# Patient Record
Sex: Male | Born: 1965 | Race: White | Hispanic: No | Marital: Single | Smoking: Current every day smoker
Health system: Southern US, Community
[De-identification: ages and names within clinical notes are randomized; demographics above are authoritative.]

## PROBLEM LIST (undated history)

## (undated) DIAGNOSIS — J189 Pneumonia, unspecified organism: Secondary | ICD-10-CM

## (undated) DIAGNOSIS — N200 Calculus of kidney: Secondary | ICD-10-CM

## (undated) DIAGNOSIS — M1990 Unspecified osteoarthritis, unspecified site: Secondary | ICD-10-CM

## (undated) HISTORY — PX: LITHOTRIPSY: SUR834

## (undated) HISTORY — PX: NOSE SURGERY: SHX723

---

## 2010-08-28 ENCOUNTER — Emergency Department (HOSPITAL_COMMUNITY)
Admission: EM | Admit: 2010-08-28 | Discharge: 2010-08-28 | Disposition: A | Discharge status: Home / Self Care | Payer: Self-pay | Attending: Emergency Medicine | Admitting: Emergency Medicine

## 2010-08-28 DIAGNOSIS — N200 Calculus of kidney: Secondary | ICD-10-CM | POA: Insufficient documentation

## 2010-08-28 DIAGNOSIS — Z9889 Other specified postprocedural states: Secondary | ICD-10-CM | POA: Insufficient documentation

## 2010-08-28 DIAGNOSIS — R319 Hematuria, unspecified: Secondary | ICD-10-CM | POA: Insufficient documentation

## 2010-08-28 DIAGNOSIS — R109 Unspecified abdominal pain: Secondary | ICD-10-CM | POA: Insufficient documentation

## 2010-08-28 LAB — URINALYSIS, ROUTINE W REFLEX MICROSCOPIC
Bilirubin Urine: NEGATIVE
Nitrite: NEGATIVE
Protein, ur: NEGATIVE mg/dL
Urobilinogen, UA: 0.2 mg/dL (ref 0.0–1.0)
pH: 7 (ref 5.0–8.0)

## 2010-08-29 LAB — URINE CULTURE
Colony Count: 2000
Culture  Setup Time: 201207282004

## 2010-08-30 LAB — POCT I-STAT, CHEM 8
Calcium, Ion: 1.13 mmol/L (ref 1.12–1.32)
Chloride: 104 mEq/L (ref 96–112)
Glucose, Bld: 91 mg/dL (ref 70–99)
HCT: 46 % (ref 39.0–52.0)
Hemoglobin: 15.6 g/dL (ref 13.0–17.0)
TCO2: 24 mmol/L (ref 0–100)

## 2012-07-15 ENCOUNTER — Encounter (HOSPITAL_BASED_OUTPATIENT_CLINIC_OR_DEPARTMENT_OTHER): Payer: Self-pay | Admitting: *Deleted

## 2012-07-15 ENCOUNTER — Emergency Department (HOSPITAL_BASED_OUTPATIENT_CLINIC_OR_DEPARTMENT_OTHER)
Admission: EM | Admit: 2012-07-15 | Discharge: 2012-07-15 | Disposition: A | Discharge status: Home / Self Care | Payer: Self-pay | Attending: Emergency Medicine | Admitting: Emergency Medicine

## 2012-07-15 ENCOUNTER — Emergency Department (HOSPITAL_BASED_OUTPATIENT_CLINIC_OR_DEPARTMENT_OTHER): Payer: Self-pay

## 2012-07-15 DIAGNOSIS — M19072 Primary osteoarthritis, left ankle and foot: Secondary | ICD-10-CM

## 2012-07-15 DIAGNOSIS — Z8781 Personal history of (healed) traumatic fracture: Secondary | ICD-10-CM | POA: Insufficient documentation

## 2012-07-15 DIAGNOSIS — Z8739 Personal history of other diseases of the musculoskeletal system and connective tissue: Secondary | ICD-10-CM | POA: Insufficient documentation

## 2012-07-15 DIAGNOSIS — M19079 Primary osteoarthritis, unspecified ankle and foot: Secondary | ICD-10-CM | POA: Insufficient documentation

## 2012-07-15 HISTORY — DX: Unspecified osteoarthritis, unspecified site: M19.90

## 2012-07-15 MED ORDER — HYDROCODONE-ACETAMINOPHEN 5-325 MG PO TABS: 2.0000 | ORAL_TABLET | Freq: Four times a day (QID) | ORAL | Status: DC | PRN

## 2012-07-15 NOTE — ED Notes (Signed)
MD at bedside. 

## 2012-07-15 NOTE — ED Notes (Signed)
Patient here with ongoing right great toe pain for months, hit the toe that has caused increased pain, pain with ambulation. Thinks he has arthritis, no redness to same

## 2012-07-15 NOTE — ED Provider Notes (Signed)
History  This chart was scribed for Charles B. Bernette Mayers, MD by Manuela Schwartz, ED scribe. This patient was seen in room MHT13/MHT13 and the patient's care was started at 1939.   CSN: [id number]  Arrival date & time 07/15/12  4098   First MD Initiated Contact with Patient 07/15/12 2015      Chief Complaint  Patient presents with  . Toe Pain   Patient is a 47 y.o. male presenting with toe pain. The history is provided by the patient. No language interpreter was used.  Toe Pain This is a new problem. The current episode started 3 to 5 hours ago. The problem occurs constantly. The problem has not changed since onset.Pertinent negatives include no chest pain and no shortness of breath. The symptoms are aggravated by walking. Nothing relieves the symptoms. He has tried nothing for the symptoms.   HPI Comments: Elijah Reyes is a 47 y.o. male who presents to the Emergency Department complaining of constant moderate right great toe pain after he stubbed it today at work. He states hx of arthritis and has previously broken his toe before. He states this is a recurrent injury and now painful with ambulation. He denies any other injuries and has not tried any medications at home for this problem.    Past Medical History  Diagnosis Date  . Arthritis     History reviewed. No pertinent past surgical history.  No family history on file.  History  Substance Use Topics  . Smoking status: Not on file  . Smokeless tobacco: Not on file  . Alcohol Use: Not on file      Review of Systems  Constitutional: Negative for fever and chills.  Respiratory: Negative for shortness of breath.   Cardiovascular: Negative for chest pain.  Gastrointestinal: Negative for nausea and vomiting.  Musculoskeletal:       Right great toe pain  Neurological: Negative for weakness.  All other systems reviewed and are negative.   A complete 10 system review of systems was obtained and all systems are negative except as  noted in the HPI and PMH.   Allergies  Erythromycin  Home Medications  No current outpatient prescriptions on file.  Triage Vitals: BP 153/98  Pulse 66  Temp(Src) 98.2 F (36.8 C) (Oral)  Resp 20  Ht 6' (1.829 m)  Wt 205 lb (92.987 kg)  BMI 27.8 kg/m2  SpO2 98%  Physical Exam  Constitutional: He is oriented to person, place, and time. He appears well-developed and well-nourished.  HENT:  Head: Normocephalic and atraumatic.  Neck: Neck supple.  Pulmonary/Chest: Effort normal.  Neurological: He is alert and oriented to person, place, and time. No cranial nerve deficit.  Psychiatric: He has a normal mood and affect. His behavior is normal.    ED Course  Procedures (including critical care time) DIAGNOSTIC STUDIES: Oxygen Saturation is 98% on room air, normal by my interpretation.    COORDINATION OF CARE: At 815 PM Discussed treatment plan with patient which includes right toe X-ray. Patient agrees.   Labs Reviewed - No data to display Dg Toe Great Right  07/15/2012   *RADIOLOGY REPORT*  Clinical Data: Toe injury with pain  RIGHT GREAT TOE  Comparison: none  Findings: Negative for fracture.  Normal alignment.  Mild to moderate degenerative change in the first MTP.  IMPRESSION: Negative for fracture.   Original Report Authenticated By: Janeece Riggers, M.D.     1. Osteoarthritis of toe joint, left  MDM  I personally performed the services described in this documentation, which was scribed in my presence. The recorded information has been reviewed and is accurate.   Xray neg for acute fracture. Pt requesting referral to see a doctor about his arthritis. Norco for pain.          Charles B. Bernette Mayers, MD 07/15/12 2316

## 2012-07-15 NOTE — ED Notes (Signed)
Primary assessment at 1945 by this RN is documented in error.

## 2012-08-31 ENCOUNTER — Emergency Department (HOSPITAL_COMMUNITY)
Admission: EM | Admit: 2012-08-31 | Discharge: 2012-08-31 | Disposition: A | Discharge status: Home / Self Care | Payer: Self-pay | Attending: Emergency Medicine | Admitting: Emergency Medicine

## 2012-08-31 ENCOUNTER — Emergency Department (HOSPITAL_COMMUNITY): Payer: Self-pay

## 2012-08-31 ENCOUNTER — Encounter (HOSPITAL_COMMUNITY): Payer: Self-pay | Admitting: Emergency Medicine

## 2012-08-31 DIAGNOSIS — M19071 Primary osteoarthritis, right ankle and foot: Secondary | ICD-10-CM

## 2012-08-31 DIAGNOSIS — M19079 Primary osteoarthritis, unspecified ankle and foot: Secondary | ICD-10-CM | POA: Insufficient documentation

## 2012-08-31 DIAGNOSIS — G8929 Other chronic pain: Secondary | ICD-10-CM | POA: Insufficient documentation

## 2012-08-31 MED ORDER — MELOXICAM 7.5 MG PO TABS: 7.5000 mg | ORAL_TABLET | Freq: Every day | ORAL | Status: DC

## 2012-08-31 MED ORDER — HYDROCODONE-ACETAMINOPHEN 5-325 MG PO TABS: ORAL_TABLET | ORAL | Status: DC
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
## 2012-09-01 NOTE — ED Provider Notes (Signed)
Medical screening examination/treatment/procedure(s) were performed by non-physician practitioner and as supervising physician I was immediately available for consultation/collaboration.   Kaveri Perras J. Jarin Cornfield, MD 09/01/12 1752 

## 2013-02-01 ENCOUNTER — Emergency Department (HOSPITAL_COMMUNITY): Payer: Self-pay

## 2013-02-01 ENCOUNTER — Emergency Department (HOSPITAL_COMMUNITY)
Admission: EM | Admit: 2013-02-01 | Discharge: 2013-02-01 | Disposition: A | Discharge status: Home / Self Care | Payer: Self-pay | Attending: Emergency Medicine | Admitting: Emergency Medicine

## 2013-02-01 ENCOUNTER — Encounter (HOSPITAL_COMMUNITY): Payer: Self-pay | Admitting: Emergency Medicine

## 2013-02-01 DIAGNOSIS — M791 Myalgia, unspecified site: Secondary | ICD-10-CM

## 2013-02-01 DIAGNOSIS — M129 Arthropathy, unspecified: Secondary | ICD-10-CM | POA: Insufficient documentation

## 2013-02-01 DIAGNOSIS — T148XXA Other injury of unspecified body region, initial encounter: Secondary | ICD-10-CM

## 2013-02-01 DIAGNOSIS — W1809XA Striking against other object with subsequent fall, initial encounter: Secondary | ICD-10-CM | POA: Insufficient documentation

## 2013-02-01 DIAGNOSIS — IMO0001 Reserved for inherently not codable concepts without codable children: Secondary | ICD-10-CM | POA: Insufficient documentation

## 2013-02-01 DIAGNOSIS — Y92009 Unspecified place in unspecified non-institutional (private) residence as the place of occurrence of the external cause: Secondary | ICD-10-CM | POA: Insufficient documentation

## 2013-02-01 DIAGNOSIS — F17200 Nicotine dependence, unspecified, uncomplicated: Secondary | ICD-10-CM | POA: Insufficient documentation

## 2013-02-01 DIAGNOSIS — Y9389 Activity, other specified: Secondary | ICD-10-CM | POA: Insufficient documentation

## 2013-02-01 DIAGNOSIS — S79919A Unspecified injury of unspecified hip, initial encounter: Secondary | ICD-10-CM | POA: Insufficient documentation

## 2013-02-01 DIAGNOSIS — S79929A Unspecified injury of unspecified thigh, initial encounter: Secondary | ICD-10-CM

## 2013-02-01 DIAGNOSIS — Z79899 Other long term (current) drug therapy: Secondary | ICD-10-CM | POA: Insufficient documentation

## 2013-02-01 DIAGNOSIS — S300XXA Contusion of lower back and pelvis, initial encounter: Secondary | ICD-10-CM | POA: Insufficient documentation

## 2013-02-01 MED ORDER — METHOCARBAMOL 500 MG PO TABS: 500.0000 mg | ORAL_TABLET | Freq: Two times a day (BID) | ORAL | Status: DC

## 2013-02-01 MED ORDER — PREDNISONE 20 MG PO TABS
ORAL_TABLET | ORAL | Status: DC
Start: 2013-02-01 — End: 2013-04-18

## 2013-02-01 NOTE — ED Notes (Addendum)
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 States he initially felt okay but has noticed for past few days his tailbone and R hip has been aching and it woke him from his sleep this am. Ambulatory, mae

## 2013-02-01 NOTE — ED Provider Notes (Signed)
CSN: [id number]     Arrival date & time 02/01/13  1702 History  This chart was scribed for Elijah Mutton, PA-C, working with Gerhard Munch, MD, by Ardelia Mems ED Scribe. This patient was seen in room TR06C/TR06C and the patient's care was started at 7:44 PM.   Chief Complaint  Patient presents with  . Tailbone Pain    The history is provided by the patient. No language interpreter was used.    HPI Comments: Elijah Reyes is a 48 y.o. male who presents to the Emergency Department complaining of intermittent, moderate right buttock, right hip and posterior right upper leg pain over the past few days. He states that his pain was onset by a fall through a rotten floor while working in a house, and striking his right buttocks on a bathtub. He describes his pain as aching. He states that his pain is worsened with lying supine or sitting down. He states that he has been taking Ibuprofen without relief. He denies numbness, tingling or loss of sensation. He denies bowel or bladder incontinence, testicular pain or swelling or any other symptoms.   Past Medical History  Diagnosis Date  . Arthritis    History reviewed. No pertinent past surgical history. History reviewed. No pertinent family history. History  Substance Use Topics  . Smoking status: Current Every Day Smoker    Types: Cigarettes  . Smokeless tobacco: Not on file  . Alcohol Use: No    Review of Systems  Gastrointestinal:       Denies bowel incontinence  Genitourinary: Negative for scrotal swelling and testicular pain.       Denies bladder incontinence  Musculoskeletal:       Right buttock, right hip and posterior right upper leg pain  Neurological: Negative for numbness.  All other systems reviewed and are negative.   Allergies  Erythromycin  Home Medications   Current Outpatient Rx  Name  Route  Sig  Dispense  Refill  . ibuprofen (ADVIL,MOTRIN) 200 MG tablet   Oral   Take 800 mg by mouth every 6 (six) hours as  needed (pain).         . Papaya CHEW   Oral   Chew 2 tablets by mouth 2 (two) times daily.         . ranitidine (ZANTAC) 150 MG tablet   Oral   Take 450 mg by mouth 2 (two) times daily.         . methocarbamol (ROBAXIN) 500 MG tablet   Oral   Take 1 tablet (500 mg total) by mouth 2 (two) times daily.   20 tablet   0   . predniSONE (DELTASONE) 20 MG tablet      3 tabs po day one, then 2 tabs daily x 4 days   11 tablet   0    Triage Vitals: BP 152/83  Pulse 72  Temp(Src) 98.5 F (36.9 C) (Oral)  Resp 16  Ht 6' (1.829 m)  Wt 220 lb (99.791 kg)  BMI 29.83 kg/m2  SpO2 97%  Physical Exam  Nursing note and vitals reviewed. Constitutional: He is oriented to person, place, and time. He appears well-developed and well-nourished. No distress.  HENT:  Head: Normocephalic and atraumatic.  Neck: Normal range of motion. Neck supple.  Musculoskeletal: Normal range of motion. He exhibits tenderness.  Negative deformities, swelling, erythema, inflammation, lesions, sores noted to the lumbar spine. Neck mild discomfort upon palpation to the right buttock and posterior aspect of the right  thigh. Full range of motion noted to the right hip, right knee, right ankle. Negative ataxia identified with motion. Patient able to fully flex and extend of the torso without difficulty.  Neurological: He is alert and oriented to person, place, and time. No cranial nerve deficit. He exhibits normal muscle tone. Coordination normal.  Cranial nerves III-XII grossly intact Strength 5+/5+ to lower extremities bilaterally with resistance applied, equal distribution noted Sensation intact with differentiation to sharp and dull touch to lower extremities bilaterally Heel to knee down shin normal bilaterally Gait proper, proper balance - negative sway, negative drift, negative step-offs  Skin: He is not diaphoretic.    ED Course  Procedures (including critical care time)  DIAGNOSTIC STUDIES: Oxygen  Saturation is 97% on RA, normal by my interpretation.    COORDINATION OF CARE: 7:50 PM- Discussed radiology findings. Pt advised of plan for treatment and pt agrees.  Dg Sacrum/coccyx  02/01/2013   CLINICAL DATA:  History of trauma from a fall.  Buttock pain.  EXAM: SACRUM AND COCCYX - 2+ VIEW  COMPARISON:  No priors.  FINDINGS: Three views of the sacrum and coccyx demonstrate no definite acute displaced fractures.  IMPRESSION: 1. No acute radiographic abnormality of the sacrum or coccyx.   Electronically Signed   By: Trudie Reed M.D.   On: 02/01/2013 18:48   Dg Hip Bilateral W/pelvis  02/01/2013   CLINICAL DATA:  History of fall complaining of low back and right ischial pain.  EXAM: BILATERAL HIP WITH PELVIS - 4+ VIEW  COMPARISON:  No priors.  FINDINGS: AP view of the pelvis and AP and lateral views of the left hip demonstrate no acute displaced fracture, subluxation, dislocation, joint or soft tissue abnormality.  IMPRESSION: 1. No acute radiographic abnormality of the bony pelvis or the left hip.   Electronically Signed   By: Trudie Reed M.D.   On: 02/01/2013 18:46    Labs Review Labs Reviewed - No data to display Imaging Review Dg Sacrum/coccyx  02/01/2013   CLINICAL DATA:  History of trauma from a fall.  Buttock pain.  EXAM: SACRUM AND COCCYX - 2+ VIEW  COMPARISON:  No priors.  FINDINGS: Three views of the sacrum and coccyx demonstrate no definite acute displaced fractures.  IMPRESSION: 1. No acute radiographic abnormality of the sacrum or coccyx.   Electronically Signed   By: Trudie Reed M.D.   On: 02/01/2013 18:48   Dg Hip Bilateral W/pelvis  02/01/2013   CLINICAL DATA:  History of fall complaining of low back and right ischial pain.  EXAM: BILATERAL HIP WITH PELVIS - 4+ VIEW  COMPARISON:  No priors.  FINDINGS: AP view of the pelvis and AP and lateral views of the left hip demonstrate no acute displaced fracture, subluxation, dislocation, joint or soft tissue abnormality.   IMPRESSION: 1. No acute radiographic abnormality of the bony pelvis or the left hip.   Electronically Signed   By: Trudie Reed M.D.   On: 02/01/2013 18:46    EKG Interpretation   None       MDM   1. Contusion of bone   2. Muscular pain     Filed Vitals:   02/01/13 1717 02/01/13 2018  BP: 152/83 140/85  Pulse: 72 66  Temp: 98.5 F (36.9 C) 98 F (36.7 C)  TempSrc: Oral Oral  Resp: 16 16  Height: 6' (1.829 m)   Weight: 220 lb (99.791 kg)   SpO2: 97% 98%   I personally performed the services  described in this documentation, which was scribed in my presence. The recorded information has been reviewed and is accurate.  Patient presented to emergency department with right buttock, right groin and lower back pain that has been ongoing for the past 8-9 days. Patient reports that while working his leg fell through the floor and landed on his groin. Reports he's been using ibuprofen with minimal relief. Alert and oriented. GCS 15. Pulses palpable and strong, DP 2+ bilaterally. Full range of motion to lower extremities bilaterally without difficulty noted. Discomfort upon palpation to right buttock and posterior aspect of right thigh. Sensation intact with differentiation to sharp and dull touch. Strength intact with equal distribution. Negative ataxia noted to motion. Negative deformities, ecchymosis, swelling noted.  Plain films negative findings for acute abnormalities or injuries - negative findings noted to plain film of sacrum/coccyx and bilateral hip with pelvis. Patient stable, afebrile. Doubt cauda equina syndrome. Negative findings for fractures or abnormalities. Suspicion to be muscular discomfort secondary to trauma. Possible bone contusion. Patient neurovascularly intact. Negative ataxia identified a physical exam. Patient discharged. Discharge patient with steroids and muscle relaxers. Referred patient to orthopedics. Discussed with patient to rest, ice and stay hydrated.  Discussed with patient to avoid any physical or strenuous activity. Discussed with patient to closely monitor symptoms and if symptoms are to worsen or change to report back to the ED - strict return instructions given.  Patient agreed to plan of care, understood, all questions answered.      Elijah MuttonMarissa Loki Wuthrich, PA-C 02/02/13 [phone]

## 2013-02-01 NOTE — Discharge Instructions (Signed)
Please call and set up an appointment with Dr. Kevan Ny, orthopedics regarding ongoing right hip and buttock pain at the ongoing for approximately 9 days Please rest and stay hydrated Please massage with icy hot ointment Please apply warm compressions and massage Please avoid any physical or strenuous activity Please take medications as prescribed-please take on a. Neck Please continue monitor symptoms and if symptoms are to worsen or change (fever greater than 101, chills, nausea, vomiting, chest pain, shortness of breath, difficulty breathing, weakness, numbness, loss of sensation, worsening pain, fall, injury, inability to control urine or bowel movements) please report back to the ED  Back Pain, Adult Low back pain is very common. About 1 in 5 people have back pain.The cause of low back pain is rarely dangerous. The pain often gets better over time.About half of people with a sudden onset of back pain feel better in just 2 weeks. About 8 in 10 people feel better by 6 weeks.  CAUSES Some common causes of back pain include:  Strain of the muscles or ligaments supporting the spine.  Wear and tear (degeneration) of the spinal discs.  Arthritis.  Direct injury to the back. DIAGNOSIS Most of the time, the direct cause of low back pain is not known.However, back pain can be treated effectively even when the exact cause of the pain is unknown.Answering your caregiver's questions about your overall health and symptoms is one of the most accurate ways to make sure the cause of your pain is not dangerous. If your caregiver needs more information, he or she may order lab work or imaging tests (X-rays or MRIs).However, even if imaging tests show changes in your back, this usually does not require surgery. HOME CARE INSTRUCTIONS For many people, back pain returns.Since low back pain is rarely dangerous, it is often a condition that people can learn to [employer] their own.   Remain active. It is  stressful on the back to sit or stand in one place. Do not sit, drive, or stand in one place for more than 30 minutes at a time. Take short walks on level surfaces as soon as pain allows.Try to increase the length of time you walk each day.  Do not stay in bed.Resting more than 1 or 2 days can delay your recovery.  Do not avoid exercise or work.Your body is made to move.It is not dangerous to be active, even though your back may hurt.Your back will likely heal faster if you return to being active before your pain is gone.  Pay attention to your body when you bend and lift. Many people have less discomfortwhen lifting if they bend their knees, keep the load close to their bodies,and avoid twisting. Often, the most comfortable positions are those that put less stress on your recovering back.  Find a comfortable position to sleep. Use a firm mattress and lie on your side with your knees slightly bent. If you lie on your back, put a pillow under your knees.  Only take over-the-counter or prescription medicines as directed by your caregiver. Over-the-counter medicines to reduce pain and inflammation are often the most helpful.Your caregiver may prescribe muscle relaxant drugs.These medicines help dull your pain so you can more quickly return to your normal activities and healthy exercise.  Put ice on the injured area.  Put ice in a plastic bag.  Place a towel between your skin and the bag.  Leave the ice on for 15-20 minutes, 03-04 times a day for the first 2  to 3 days. After that, ice and heat may be alternated to reduce pain and spasms.  Ask your caregiver about trying back exercises and gentle massage. This may be of some benefit.  Avoid feeling anxious or stressed.Stress increases muscle tension and can worsen back pain.It is important to recognize when you are anxious or stressed and learn ways to manage it.Exercise is a great option. SEEK MEDICAL CARE IF:  You have pain that is  not relieved with rest or medicine.  You have pain that does not improve in 1 week.  You have new symptoms.  You are generally not feeling well. SEEK IMMEDIATE MEDICAL CARE IF:   You have pain that radiates from your back into your legs.  You develop new bowel or bladder control problems.  You have unusual weakness or numbness in your arms or legs.  You develop nausea or vomiting.  You develop abdominal pain.  You feel faint. Document Released: 01/17/2005 Document Revised: 07/19/2011 Document Reviewed: 06/07/2010 [facility] Patient Information 2014 [place] [place].   Emergency Department Resource Guide 1) Find a Doctor and Pay Out of Pocket Although you won't have to find out who is covered by your insurance plan, it is a good idea to ask around and get recommendations. You will then need to call the office and see if the doctor you have chosen will accept you as a new patient and what types of options they offer for patients who are self-pay. Some doctors offer discounts or will set up payment plans for their patients who do not have insurance, but you will need to ask so you aren't surprised when you get to your appointment.  2) Contact Your Local Health Department Not all health departments have doctors that can see patients for sick visits, but many do, so it is worth a call to see if yours does. If you don't know where your local health department is, you can check in your phone book. The CDC also has a tool to help you locate your state's health department, and many state websites also have listings of all of their local health departments.  3) Find a Walk-in Clinic If your illness is not likely to be very severe or complicated, you may want to try a walk in clinic. These are popping up all over the country in pharmacies, drugstores, and shopping centers. They're usually staffed by nurse practitioners or physician assistants that have been trained to treat common illnesses and  complaints. They're usually fairly quick and inexpensive. However, if you have serious medical issues or chronic medical problems, these are probably not your best option.  No Primary Care Doctor: - Call Health Connect at  [phone] - they can help you locate a primary care doctor that  accepts your insurance, provides certain services, etc. - Physician Referral Service- [phone]  Chronic Pain Problems: Organization         Address  Phone   Notes  [facility]  [phone] Patients need to be referred by their primary care doctor.   Medication Assistance: Organization         Address  Phone   Notes  [facility] [address] [place], [place] [address] [phone] --Must be a resident of [place] -- Must have NO insurance coverage whatsoever (no Medicaid/ Medicare, etc.) -- The pt. MUST have a primary care doctor that directs their care regularly and follows them in the community   [facility]  (  [phone]   [employer]  [phone]    Agencies that provide inexpensive medical care: Organization         Address  Phone   Notes  [facility]  [phone]   [facility]    [phone]   [facility] [address] [place], [place] [address] [phone]   [facility] [address] [place] [place] [phone]   Planned Parenthood    [phone]   [facility]    [phone]   [facility]  [address], [place] Phone:  [phone], Fax:  [phone] Hours of Operation:  9 am - 6 pm, M-F.  Also accepts Medicaid/Medicare and self-pay.  [facility]  [address], [place] Phone: [phone], Fax: [phone]. Hours of Operation:  8:30 am - 5:30 pm, M-F.  Also accepts Medicaid and self-pay.  [facility] [place] [address] [place] Phone: [phone]   [facility] [address] Natasha Bence [place], [place] [phone], Ext. [phone] Mondays & Thursdays: 7-9 AM.  First 15 patients are seen on a first come, first serve basis.    Medicaid-accepting [facility] Providers:  Organization         Address  Phone   Notes  [facility] [address],  [phone] Also accepts self-pay patients.  [facility] [address] Laurell Josephs [place], [place]  [phone]   [facility] [address], [place] [phone]   [facility] [address] [place] [phone]   Renaye Rakers [address], [place]   [phone] Only accepts [place] Access [place] patients after they have their name applied to their card.   Self-Pay (no insurance) in [facility]:  Organization         Address  Phone   Notes  Sickle Cell Patients, [facility] [address] [place], [place] [phone]   [facility] [address] [place], [place] [phone]   [facility] [place]  [address], [place] [phone]   [facility]/Dr. Osei-Bonsu  [address], [place] or [address], [place] [phone] Phone number for both [place] and [place] locations is the same.  [facility] and [facility] [address], [place] [phone]   [facility] [address] [place] or [address] [phone]   [facility] [address], [place] [phone], phone; [phone], fax Sees patients 1st and 3rd Saturday of every month.  Must not qualify for public or private insurance (i.e. Medicaid, Medicare, [place] Health Choice, Veterans' Benefits)  Household income should be no more than 200% of the poverty level  The clinic cannot treat you if you are pregnant or think you are pregnant  Sexually transmitted diseases are not treated at the clinic.    Dental Care: Organization         Address  Phone  Notes  [place] [facility] [address] [place], [place] [phone] Accepts children up to age 69 who are enrolled in [place] or  Health Choice; pregnant women  with a Medicaid card; and children who have applied for Medicaid or [place] Health Choice, but were declined, whose parents can pay a reduced fee at time of service.  [facility] of [facility]  [address] [phone] Accepts children up to age 59 who are enrolled in [place] or [place] Health Choice; pregnant women with a Medicaid card; and children who have applied for Medicaid or [place] Health Choice, but were declined, whose parents can pay a reduced fee at time of service.  [facility]  [address] [place], [place] [phone] Patients are seen by appointment only. Walk-ins are not accepted. [facility] will see patients 24 years of age and older. Monday - Tuesday (8am-5pm) Most Wednesdays (8:30-5pm) $30 per visit, cash only  [facility]  [address], [facility] [phone] Patients are seen by appointment only. Walk-ins are not accepted. [facility] will see patients 59 years of age and older. One Wednesday Evening (Monthly: Volunteer Based).  $30 per visit, cash only  [employer]  [phone] for adults; Children under age 62, call Graduate Pediatric Dentistry at [phone]. Children aged 38-14, please call [phone] to request a pediatric application.  Dental services are provided in all areas of dental care including fillings, crowns and bridges, complete and partial dentures, implants, gum treatment, root canals, and extractions. Preventive care is  also provided. Treatment is provided to both adults and children. Patients are selected via a lottery and there is often a waiting list.   [facility] [address] [place]  [phone] [email or web address]   [facility] [address] [place], [place] [phone], Ext. [phone] Second and Fourth Thursday of each month, opens at 6:30 AM; Clinic ends at 9 AM.  Patients are seen on a first-come first-served basis, and a limited number are seen during each clinic.   [facility]  [address] Ether Griffins [place], [place] [phone]   Eligibility Requirements You must have lived in [place], [place], or [place] counties for at least the last three months.   You cannot be eligible for state or federal sponsored [place], including [employer], [place], or [employer].   You generally cannot be eligible for healthcare insurance through your employer.    How to apply: Eligibility screenings are held every Tuesday and Wednesday afternoon from 1:00 pm until 4:00 pm. You do not need an appointment for the interview!  [facility] [address], [place], [place] [phone]   [facility] Health Department  [phone]   [facility] Department  [phone]   [facility] Health Department  [phone]    Behavioral Health Resources in the Community: Intensive Outpatient Programs Organization         Address  Phone  Notes  [facility] Services [address] [place], [place] [phone]   [facility] [address], [place], [place] [phone]   [facility] [address], [place], [place]  [phone]   [facility] [address]  [place], [place] [phone] or [phone]   Substance Abuse Resources Organization         Address  Phone  Notes  [facility] Drug Services  [phone]   [facility]  [phone]   [facility] [place]  [phone]   [facility]  [phone]   Residential & Outpatient Substance Abuse Program  [phone]   Psychological Services Organization         Address  Phone  Notes  [facility]  [phone]   [facility]  [phone]   [facility] [address], [place] [phone] or [phone]    Mobile Crisis Teams Organization         Address  Phone  Notes  [facility], Mobile Crisis Care Unit  [phone]   [facility]  [address] [place], [place] [phone]   Doristine Locks [address] [place] [phone]    Self-Help/Support Groups Organization         Address  Phone             Notes  [facility] [place] - variety of support groups  336- I7437963 Call for more information  Narcotics Anonymous (NA), [facility] [address], [employer] [place]  2 meetings at this location   Statistician         Address  Phone  Notes  [facility] [address],    [place]  [phone]   [facility]  [address], [place] [address], [place], [place] [phone]   [facility] [address] [place], [place] [phone] Admissions: 8am-3pm M-F  [facility] [address]    [place], [place] [phone]   [facility] [address] [place], [place], [place] [phone]   [facility] [address],  [place], [place] [phone]   [facility] - Intensive Outpatient [address] Laurell Joseph[address], [place], [place] [phone]   [facility] ([facility] [address]  [place], [place] [phone] or [phone]   [facility] ([facility]) [address] [place], [place] [phone] Accepts Medicaid  [facility] [address],  [place] [phone]  Substance Abuse/Addiction Treatment   [facility] Organization         Address  Phone  Notes  [facility]  [phone]   Angie Fava, PhD [address] Ervin Knack [place], [place]   [phone] or [phone]   [facility]   [address] [place], [place] [phone]   [facility] [address] [place], [place] [phone] Insurance/Medicaid/sponsorship through [facility] and Families [address]                                    [place], [place] [phone] Therapy/tele-psych/case  [facility] [address], [place] [phone]    Dr. Lolly Mustache  [phone]   [facility]  [employer] [facility] Dept. 1) [address], [place] 2) [address], [place] 3)  [address], [place] [phone]  [phone]   [facility] Child Abuse Hotline (432)[phone] or [phone] (After Hours)

## 2013-02-03 NOTE — ED Provider Notes (Signed)
  I personally performed the services described in this documentation, which was scribed in my presence. The recorded information has been reviewed and is accurate.     Ayano Douthitt, MD 02/03/13 0002 

## 2013-04-18 ENCOUNTER — Emergency Department (HOSPITAL_COMMUNITY): Payer: Self-pay

## 2013-04-18 ENCOUNTER — Encounter (HOSPITAL_COMMUNITY): Payer: Self-pay | Admitting: Emergency Medicine

## 2013-04-18 ENCOUNTER — Emergency Department (HOSPITAL_COMMUNITY)
Admission: EM | Admit: 2013-04-18 | Discharge: 2013-04-18 | Disposition: A | Discharge status: Home / Self Care | Payer: Self-pay | Attending: Emergency Medicine | Admitting: Emergency Medicine

## 2013-04-18 DIAGNOSIS — Z87442 Personal history of urinary calculi: Secondary | ICD-10-CM | POA: Insufficient documentation

## 2013-04-18 DIAGNOSIS — M129 Arthropathy, unspecified: Secondary | ICD-10-CM | POA: Insufficient documentation

## 2013-04-18 DIAGNOSIS — Z23 Encounter for immunization: Secondary | ICD-10-CM | POA: Insufficient documentation

## 2013-04-18 DIAGNOSIS — Y929 Unspecified place or not applicable: Secondary | ICD-10-CM | POA: Insufficient documentation

## 2013-04-18 DIAGNOSIS — S61409A Unspecified open wound of unspecified hand, initial encounter: Secondary | ICD-10-CM | POA: Insufficient documentation

## 2013-04-18 DIAGNOSIS — Z881 Allergy status to other antibiotic agents status: Secondary | ICD-10-CM | POA: Insufficient documentation

## 2013-04-18 DIAGNOSIS — Z79899 Other long term (current) drug therapy: Secondary | ICD-10-CM | POA: Insufficient documentation

## 2013-04-18 DIAGNOSIS — Y93H9 Activity, other involving exterior property and land maintenance, building and construction: Secondary | ICD-10-CM | POA: Insufficient documentation

## 2013-04-18 DIAGNOSIS — S61412A Laceration without foreign body of left hand, initial encounter: Secondary | ICD-10-CM

## 2013-04-18 DIAGNOSIS — W268XXA Contact with other sharp object(s), not elsewhere classified, initial encounter: Secondary | ICD-10-CM | POA: Insufficient documentation

## 2013-04-18 DIAGNOSIS — F17200 Nicotine dependence, unspecified, uncomplicated: Secondary | ICD-10-CM | POA: Insufficient documentation

## 2013-04-18 HISTORY — DX: Calculus of kidney: N20.0

## 2013-04-18 MED ORDER — HYDROCODONE-ACETAMINOPHEN 5-325 MG PO TABS
2.0000 | ORAL_TABLET | Freq: Once | ORAL | Status: AC
  Administered 2013-04-18: 2 via ORAL
  Filled 2013-04-18: qty 2

## 2013-04-18 MED ORDER — TETANUS-DIPHTH-ACELL PERTUSSIS 5-2.5-18.5 LF-MCG/0.5 IM SUSP
0.5000 mL | Freq: Once | INTRAMUSCULAR | Status: AC
  Administered 2013-04-18: 0.5 mL via INTRAMUSCULAR
  Filled 2013-04-18: qty 0.5

## 2013-04-18 NOTE — ED Provider Notes (Signed)
CSN: [id number]     Arrival date & time 04/18/13  1742 History   This chart was scribed for non-physician practitioner Dierdre Forth, PA-C working with Gavin Pound. Oletta Lamas, MD by Donne Anon, ED Scribe. This patient was seen in room TR07C/TR07C and the patient's care was started at 1833.  First MD Initiated Contact with Patient 04/18/13 1833     Chief Complaint  Patient presents with  . Laceration    The history is provided by the patient and medical records. No language interpreter was used.   HPI Comments: Elijah Reyes is a 48 y.o. male who presents to the Emergency Department complaining of a laceration to his left hand that occurred 2 hours prior to being seen while he was cutting tile and accidentally cut himself with a rotary cutter. He states the tile shattered, and it is possible that pieces of tile are in the wound. He denies numbness or tingling in his fingers, or any other symptoms. He did not take anything for the pain or apply anything to the wound other than wrapping his hand.   His tetanus is not UTD. He states he is otherwise healthy and reports his only daily medication as Zantac. He is a current everyday smoker.    Past Medical History  Diagnosis Date  . Arthritis   . Kidney stone    Past Surgical History  Procedure Laterality Date  . Lithotripsy    . Nose surgery     No family history on file. History  Substance Use Topics  . Smoking status: Current Every Day Smoker    Types: Cigarettes  . Smokeless tobacco: Not on file  . Alcohol Use: No    Review of Systems  Constitutional: Negative for fever and chills.  HENT: Negative for congestion and sore throat.   Gastrointestinal: Negative for nausea and vomiting.  Musculoskeletal: Negative for back pain.  Skin: Positive for wound.  Neurological: Negative for weakness and numbness.      Allergies  Erythromycin  Home Medications   Current Outpatient Rx  Name  Route  Sig  Dispense  Refill  . ibuprofen  (ADVIL,MOTRIN) 200 MG tablet   Oral   Take 800 mg by mouth every 6 (six) hours as needed (pain).         . ranitidine (ZANTAC) 150 MG tablet   Oral   Take 450 mg by mouth 2 (two) times daily.          BP 132/83  Pulse 60  Temp(Src) 97.9 F (36.6 C) (Oral)  Resp 22  Ht 6' (1.829 m)  Wt 213 lb 11.2 oz (96.934 kg)  BMI 28.98 kg/m2  SpO2 100%  Physical Exam  Nursing note and vitals reviewed. Constitutional: He is oriented to person, place, and time. He appears well-developed and well-nourished. No distress.  HENT:  Head: Normocephalic and atraumatic.  Eyes: Conjunctivae are normal. No scleral icterus.  Neck: Normal range of motion.  Cardiovascular: Normal rate, regular rhythm, normal heart sounds and intact distal pulses.   No murmur heard. Capillary refill < 3 sec  Pulmonary/Chest: Effort normal and breath sounds normal. No respiratory distress.  Musculoskeletal: Normal range of motion. He exhibits no edema.  4 cm jagged laceration to the medial palm of the left hand.  Full range of motion of all fingers of the left hand; full range of motion of the left wrist  Neurological: He is alert and oriented to person, place, and time. He exhibits normal muscle tone. Coordination normal.  Normal sensation in all digits of the left hand. 5/5 strength including resisted flexion and extension of all digits. Strong grip strength.  Skin: Skin is warm and dry. He is not diaphoretic. No erythema.  Psychiatric: He has a normal mood and affect.    ED Course  Procedures (including critical care time) DIAGNOSTIC STUDIES: Oxygen Saturation is 100% on RA, normal by my interpretation.    COORDINATION OF CARE: 7:11 PM Discussed treatment plan which includes Vicodin, having pt soak hand in soap and water prior to hand xray and laceration repair with pt at bedside and pt agreed to plan. He has a ride home.   8:29 PM Rechecked pt. Discussed negative imaging results. Laceration repair performed.  Wound care discussed. Return precautions discussed. Advised pt to have sutures removed in 7-10 days.   LACERATION REPAIR PROCEDURE NOTE The patient's identification was confirmed and consent was obtained. This procedure was performed by Dierdre ForthHannah Arvil Utz, PA-C at 8:29 PM. Site: medial aspect of left hand Sterile procedures observed Anesthetic used (type and amt): 7 cc of lidocaine 2% without epinephrine  Suture type/size: 5.0 prolene  Length:4 cm jagged laceration # of Sutures: 6 Technique: simple interrupted Complexity: complex Tetanus ordered Site anesthetized, irrigated with NS, explored without evidence of foreign body, wound well approximated, site covered with dry, sterile dressing.  Patient tolerated procedure well without complications. Instructions for care discussed verbally and patient provided with additional written instructions for homecare and f/u.    Labs Review Labs Reviewed - No data to display Imaging Review Dg Hand 2 View Left  04/18/2013   CLINICAL DATA:  Laceration.  EXAM: LEFT HAND - 2 VIEW  COMPARISON:  None.  FINDINGS: The joint spaces are maintained. No acute fractures identified. No radiopaque foreign body.  IMPRESSION: No acute bony findings or radiopaque foreign body.   Electronically Signed   By: Loralie ChampagneMark  Gallerani M.D.   On: 04/18/2013 20:09     EKG Interpretation None      MDM   Final diagnoses:  None   Elijah ColumbiaJoseph Reyes presents with laceration to the left palm.  Tdap booster given. Pressure irrigation performed. Wound explored. No evidence of foreign body at the base of the wound. Laceration occurred < 8 hours prior to repair which was well tolerated. Pt has no co morbidities to effect normal wound healing. Discussed suture home care w pt and answered questions. Pt to f-u for wound check and suture removal in 7 days. Pt is hemodynamically stable w no complaints prior to dc.    It has been determined that no acute conditions requiring further  emergency intervention are present at this time. The patient/guardian have been advised of the diagnosis and plan. We have discussed signs and symptoms that warrant return to the ED, such as changes or worsening in symptoms.   Vital signs are stable at discharge.   BP 132/83  Pulse 60  Temp(Src) 97.9 F (36.6 C) (Oral)  Resp 22  Ht 6' (1.829 m)  Wt 213 lb 11.2 oz (96.934 kg)  BMI 28.98 kg/m2  SpO2 100%  Patient/guardian has voiced understanding and agreed to follow-up with the PCP or specialist.    I personally performed the services described in this documentation, which was scribed in my presence. The recorded information has been reviewed and is accurate.    Dahlia ClientHannah Chaniah Cisse, PA-C 04/18/13 2159

## 2013-04-18 NOTE — ED Notes (Signed)
Pt st's he was cutting tile and cut his left hand.   Pt has lac to side of left hand.  Bleeding controlled at this time

## 2013-04-18 NOTE — ED Notes (Signed)
Patient transported to X-ray 

## 2013-04-18 NOTE — Discharge Instructions (Signed)
1. Medications: usual home medications 2. Treatment: rest, drink plenty of fluids, tylenol/ibuprofen for pain, use ice for swelling 3. Follow Up: Please return to the ED or an urgent care for suture removal in 7-10 days.    Follow up with your doctor, an urgent care, or this Emergency Department for removal of your stitches in 7 days. Do not submerge the stitches in water for the first 24 hours. Tylenol or ibuprofen for pain control.  Keep bandage dry. Read instructions below.  TREATMENT   Keep the wound clean and dry.   If you were given a bandage (dressing), you should change it at least once a day. Also, change the dressing if it becomes wet or dirty, or as directed by your caregiver.   Wash the wound with soap and water 2 times a day. Rinse the wound off with water to remove all soap. Pat the wound dry with a clean towel.   You may shower as usual after the first 24 hours. Do not soak the wound in water until the sutures are removed.   Once the wound has healed, scarring can be minimized by covering the wound with sunscreen during the day for 1 full year.Marland Kitchen.   SEEK MEDICAL CARE IF:   You have redness, swelling, or increasing pain in the wound.   You see a red line that goes away from the wound.   You have yellowish-white fluid (pus) coming from the wound.   You have a fever.   You notice a bad smell coming from the wound or dressing.   Your wound breaks open before or after sutures have been removed.   You notice something coming out of the wound such as wood or glass.   Your wound is on your hand or foot and you cannot move a finger or toe.   Your pain is not controlled with prescribed medicine.   If you did not receive a tetanus shot today because you thought you were up to date, but did not recall when your last one was given, make sure to check with your primary caregiver to determine if you need one.

## 2013-04-22 NOTE — ED Provider Notes (Signed)
Medical screening examination/treatment/procedure(s) were performed by non-physician practitioner and as supervising physician I was immediately available for consultation/collaboration.  Homar Weinkauf Y. Sharetta Ricchio, MD 04/22/13 0916 

## 2015-09-15 ENCOUNTER — Encounter (HOSPITAL_COMMUNITY): Payer: Self-pay | Admitting: *Deleted

## 2015-09-15 ENCOUNTER — Emergency Department (HOSPITAL_COMMUNITY)
Admission: EM | Admit: 2015-09-15 | Discharge: 2015-09-15 | Disposition: A | Discharge status: Home / Self Care | Payer: Self-pay | Attending: Emergency Medicine | Admitting: Emergency Medicine

## 2015-09-15 ENCOUNTER — Emergency Department (HOSPITAL_COMMUNITY): Payer: Self-pay

## 2015-09-15 DIAGNOSIS — F17210 Nicotine dependence, cigarettes, uncomplicated: Secondary | ICD-10-CM | POA: Insufficient documentation

## 2015-09-15 DIAGNOSIS — Z791 Long term (current) use of non-steroidal anti-inflammatories (NSAID): Secondary | ICD-10-CM | POA: Insufficient documentation

## 2015-09-15 DIAGNOSIS — R0789 Other chest pain: Secondary | ICD-10-CM | POA: Insufficient documentation

## 2015-09-15 HISTORY — DX: Pneumonia, unspecified organism: J18.9

## 2015-09-15 LAB — CBC WITH DIFFERENTIAL/PLATELET
Basophils Absolute: 0 10*3/uL (ref 0.0–0.1)
Basophils Relative: 0 %
Eosinophils Absolute: 0.2 10*3/uL (ref 0.0–0.7)
Eosinophils Relative: 3 %
HCT: 42.1 % (ref 39.0–52.0)
Hemoglobin: 15.2 g/dL (ref 13.0–17.0)
Lymphocytes Relative: 28 %
Lymphs Abs: 2 10*3/uL (ref 0.7–4.0)
MCH: 33.8 pg (ref 26.0–34.0)
MCHC: 36.1 g/dL — ABNORMAL HIGH (ref 30.0–36.0)
MCV: 93.6 fL (ref 78.0–100.0)
Monocytes Absolute: 0.9 10*3/uL (ref 0.1–1.0)
Monocytes Relative: 12 %
Neutro Abs: 4.2 10*3/uL (ref 1.7–7.7)
Neutrophils Relative %: 57 %
Platelets: 168 10*3/uL (ref 150–400)
RBC: 4.5 MIL/uL (ref 4.22–5.81)
RDW: 12.6 % (ref 11.5–15.5)
WBC: 7.3 10*3/uL (ref 4.0–10.5)

## 2015-09-15 LAB — BASIC METABOLIC PANEL
Anion gap: 7 (ref 5–15)
BUN: 17 mg/dL (ref 6–20)
CO2: 24 mmol/L (ref 22–32)
Calcium: 8.7 mg/dL — ABNORMAL LOW (ref 8.9–10.3)
Chloride: 105 mmol/L (ref 101–111)
Creatinine, Ser: 0.79 mg/dL (ref 0.61–1.24)
GFR calc Af Amer: >60 mL/min (ref >60)
GFR calc non Af Amer: >60 mL/min (ref >60)
Glucose, Bld: 100 mg/dL — ABNORMAL HIGH (ref 65–99)
Potassium: 4.1 mmol/L (ref 3.5–5.1)
Sodium: 136 mmol/L (ref 135–145)

## 2015-09-15 LAB — TROPONIN I: Troponin I: <0.03 ng/mL (ref <0.03)

## 2015-09-15 MED ORDER — KETOROLAC TROMETHAMINE 60 MG/2ML IM SOLN
15.0000 mg | Freq: Once | INTRAMUSCULAR | Status: AC
  Administered 2015-09-15: 15 mg via INTRAMUSCULAR
  Filled 2015-09-15: qty 2

## 2015-09-15 MED ORDER — TRAMADOL HCL 50 MG PO TABS: 50.0000 mg | ORAL_TABLET | Freq: Four times a day (QID) | ORAL | 0 refills | Status: DC | PRN

## 2015-09-15 MED ORDER — HYDROMORPHONE HCL 1 MG/ML IJ SOLN
1.0000 mg | Freq: Once | INTRAMUSCULAR | Status: AC
Start: 2015-09-15 — End: 2015-09-15
  Administered 2015-09-15: 1 mg via INTRAMUSCULAR
  Filled 2015-09-15: qty 1

## 2015-09-15 NOTE — ED Triage Notes (Signed)
Pt from home, reports R rib pain x 2 days.  Worse with movement and inhalation.  Pt has hx of PNA

## 2015-09-15 NOTE — ED Notes (Signed)
Patient c/o that no one has been in to see him and that he wants to know what is his status.  This RN informed patient that several staff including 2 ED Mds were in a room with a critical patient and we apologize for the delay in plan of care.  Patient states that pain is coming back in right ribs again.   RN went to speak with DR Juleen ChinaKohut about plan of care for this patient.  Was told he wanted a repeat EKG, RN reprinted the EKG that was done at 1347 and gave it to MD as well as making him aware that patient c/o pain returning in right rib cage area.  MD states that he will be discharging patient home with pain meds.

## 2015-09-15 NOTE — ED Provider Notes (Signed)
[facility]-EMERGENCY DEPT Provider Note   CSN: [id number] Arrival date & time: 09/15/15  1003  By signing my name below, I, Placido SouLogan Joldersma, attest that this documentation has been prepared under the direction and in the presence of Raeford RazorStephen Kaamil Morefield, MD. Electronically Signed: Placido SouLogan Joldersma, ED Scribe. 09/15/15. 10:44 AM.   History   Chief Complaint Chief Complaint  Patient presents with  . Chest Pain    HPI HPI Comments: Elijah Reyes is a 50 y.o. male who presents to the Emergency Department complaining of constant, moderate, atraumatic, worsening, right rib pain x 2 days. He states he was installing a toilet and when twisting to the right began to notice his pain. Pt states his pain is mild when sitting at rest but worsens significantly with movement, coughing or deep breathing. He takes 450 mg zantac twice daily and has taken Aleve for his pain w/o relief. He reports a mild decreased appetite noting his pain slightly increases when "feeling full". Pt reports a hx of rib fracture and pneumonia. He denies a SHx to his abd. Pt denies n/v, fevers, difficulty urinating, chills, rash or other associated symptoms at this time.   The history is provided by the patient. No language interpreter was used.   Past Medical History:  Diagnosis Date  . Arthritis   . Kidney stone   . Pneumonia     There are no active problems to display for this patient.   Past Surgical History:  Procedure Laterality Date  . LITHOTRIPSY    . NOSE SURGERY         Home Medications    Prior to Admission medications   Medication Sig Start Date End Date Taking? Authorizing Provider  ibuprofen (ADVIL,MOTRIN) 200 MG tablet Take 800 mg by mouth every 6 (six) hours as needed (pain).    Historical Provider, MD  ranitidine (ZANTAC) 150 MG tablet Take 450 mg by mouth 2 (two) times daily.    Historical Provider, MD    Family History History reviewed. No pertinent family history.  Social History Social History    Substance Use Topics  . Smoking status: Current Every Day Smoker    Types: Cigarettes  . Smokeless tobacco: Never Used  . Alcohol use No     Allergies   Erythromycin; Ibuprofen; and Naproxen   Review of Systems Review of Systems  Constitutional: Positive for appetite change. Negative for chills and fever.  Gastrointestinal: Negative for nausea and vomiting.  Genitourinary: Negative for difficulty urinating.  Musculoskeletal: Positive for myalgias.  Skin: Negative for color change and rash.  All other systems reviewed and are negative.  Physical Exam Updated Vital Signs BP 137/90 (BP Location: Right Arm)   Pulse 74   Temp 98.5 F (36.9 C) (Oral)   Resp 17   Ht 6' (1.829 m)   Wt 230 lb (104.3 kg)   SpO2 96%   BMI 31.19 kg/m   Physical Exam  Constitutional: He is oriented to person, place, and time. He appears well-developed and well-nourished.  HENT:  Head: Normocephalic.  Eyes: EOM are normal.  Neck: Normal range of motion.  Cardiovascular: Normal rate, regular rhythm and normal heart sounds.  Exam reveals no gallop and no friction rub.   No murmur heard. Pulmonary/Chest: Effort normal and breath sounds normal. No respiratory distress. He has no wheezes. He has no rales. He exhibits tenderness.  TTP to right anterior chest wall. Increased pain when sitting up or with ROM of the right shoulder.   Abdominal: He exhibits  no distension.  Musculoskeletal: Normal range of motion. He exhibits tenderness.  Neurological: He is alert and oriented to person, place, and time.  Psychiatric: He has a normal mood and affect.  Nursing note and vitals reviewed.  ED Treatments / Results  Labs (all labs ordered are listed, but only abnormal results are displayed) Labs Reviewed  CBC WITH DIFFERENTIAL/PLATELET - Abnormal; Notable for the following:       Result Value   MCHC 36.1 (*)    All other components within normal limits  BASIC METABOLIC PANEL - Abnormal; Notable for the  following:    Glucose, Bld 100 (*)    Calcium 8.7 (*)    All other components within normal limits  TROPONIN I    EKG  EKG Interpretation  Date/Time:  Tuesday September 15 2015 13:47:11 EDT Ventricular Rate:  63 PR Interval:    QRS Duration: 99 QT Interval:  418 QTC Calculation: 428 R Axis:   45 Text Interpretation:  Sinus rhythm Borderline low voltage, extremity leads Confirmed by [facility]  MD, APRIL ([id number]) on 09/17/2015 10:54:29 PM       Radiology Dg Chest 2 View  Result Date: 09/15/2015 CLINICAL DATA:  Right-sided chest pain for 2 days, no known trauma, initial encounter EXAM: CHEST  2 VIEW COMPARISON:  None. FINDINGS: Cardiac shadow is within normal limits. The lungs are clear bilaterally with the exception of some minimal platelike atelectasis in the left mid lung. No sizable effusion or infiltrate is noted. No bony abnormality is seen. IMPRESSION: Mild left midlung atelectasis. Electronically Signed   By: Alcide CleverMark  Lukens M.D.   On: 09/15/2015 10:45    Procedures Procedures  DIAGNOSTIC STUDIES: Oxygen Saturation is 96% on RA, normal by my interpretation.    COORDINATION OF CARE: 10:42 AM Discussed next steps with pt. Pt verbalized understanding and is agreeable with the plan.    Medications Ordered in ED Medications - No data to display   Initial Impression / Assessment and Plan / ED Course  I have reviewed the triage vital signs and the nursing notes.  Pertinent labs & imaging results that were available during my care of the patient were reviewed by me and considered in my medical decision making (see chart for details).  Clinical Course   49yM with R sided CP. Atypical for ACS. Doubt PE, dissection or other emergent process. Likely musculokseletal with worsening with movement/palpation and onset while moving toilet.   Final Clinical Impressions(s) / ED Diagnoses   Final diagnoses:  Atypical chest pain    New Prescriptions New Prescriptions   No  medications on file     Raeford RazorStephen Meyer Arora, MD 09/20/15 [phone]

## 2015-09-15 NOTE — Progress Notes (Signed)
Pt confirms with ED CM he is self employed without insurance nor pcp for f/u care  ED CM offered pt a list of [place] pcps for uninsured patients and [facility]  Discussed there are [place] providers that will see pts for discounted rated Pt appreciative of resources offered for f/u care

## 2015-12-21 ENCOUNTER — Observation Stay
Admission: EM | Admit: 2015-12-21 | Discharge: 2015-12-23 | Disposition: A | Discharge status: Home / Self Care | Payer: Self-pay | Attending: Specialist | Admitting: Specialist

## 2015-12-21 ENCOUNTER — Emergency Department: Payer: Self-pay

## 2015-12-21 DIAGNOSIS — N200 Calculus of kidney: Secondary | ICD-10-CM

## 2015-12-21 DIAGNOSIS — Z888 Allergy status to other drugs, medicaments and biological substances status: Secondary | ICD-10-CM | POA: Insufficient documentation

## 2015-12-21 DIAGNOSIS — M5136 Other intervertebral disc degeneration, lumbar region: Secondary | ICD-10-CM | POA: Insufficient documentation

## 2015-12-21 DIAGNOSIS — R52 Pain, unspecified: Secondary | ICD-10-CM | POA: Diagnosis present

## 2015-12-21 DIAGNOSIS — N132 Hydronephrosis with renal and ureteral calculous obstruction: Principal | ICD-10-CM | POA: Insufficient documentation

## 2015-12-21 DIAGNOSIS — N201 Calculus of ureter: Secondary | ICD-10-CM

## 2015-12-21 DIAGNOSIS — Z833 Family history of diabetes mellitus: Secondary | ICD-10-CM | POA: Insufficient documentation

## 2015-12-21 DIAGNOSIS — N179 Acute kidney failure, unspecified: Secondary | ICD-10-CM | POA: Insufficient documentation

## 2015-12-21 DIAGNOSIS — Z881 Allergy status to other antibiotic agents status: Secondary | ICD-10-CM | POA: Insufficient documentation

## 2015-12-21 DIAGNOSIS — D72829 Elevated white blood cell count, unspecified: Secondary | ICD-10-CM | POA: Insufficient documentation

## 2015-12-21 DIAGNOSIS — M1990 Unspecified osteoarthritis, unspecified site: Secondary | ICD-10-CM | POA: Insufficient documentation

## 2015-12-21 DIAGNOSIS — M5137 Other intervertebral disc degeneration, lumbosacral region: Secondary | ICD-10-CM | POA: Insufficient documentation

## 2015-12-21 DIAGNOSIS — Z886 Allergy status to analgesic agent status: Secondary | ICD-10-CM | POA: Insufficient documentation

## 2015-12-21 DIAGNOSIS — F17210 Nicotine dependence, cigarettes, uncomplicated: Secondary | ICD-10-CM | POA: Insufficient documentation

## 2015-12-21 DIAGNOSIS — Z87442 Personal history of urinary calculi: Secondary | ICD-10-CM | POA: Insufficient documentation

## 2015-12-21 DIAGNOSIS — K449 Diaphragmatic hernia without obstruction or gangrene: Secondary | ICD-10-CM | POA: Insufficient documentation

## 2015-12-21 DIAGNOSIS — F1110 Opioid abuse, uncomplicated: Secondary | ICD-10-CM | POA: Insufficient documentation

## 2015-12-21 DIAGNOSIS — R109 Unspecified abdominal pain: Secondary | ICD-10-CM

## 2015-12-21 DIAGNOSIS — Z79899 Other long term (current) drug therapy: Secondary | ICD-10-CM | POA: Insufficient documentation

## 2015-12-21 LAB — BASIC METABOLIC PANEL
ANION GAP: 11 (ref 5–15)
BUN: 10 mg/dL (ref 6–20)
CHLORIDE: 106 mmol/L (ref 101–111)
CO2: 20 mmol/L — AB (ref 22–32)
Calcium: 9.1 mg/dL (ref 8.9–10.3)
Creatinine, Ser: 1.08 mg/dL (ref 0.61–1.24)
GFR calc Af Amer: >60 mL/min (ref >60)
GLUCOSE: 142 mg/dL — AB (ref 65–99)
POTASSIUM: 4.3 mmol/L (ref 3.5–5.1)
Sodium: 137 mmol/L (ref 135–145)

## 2015-12-21 LAB — URINALYSIS COMPLETE WITH MICROSCOPIC (ARMC ONLY)
BILIRUBIN URINE: NEGATIVE
Bacteria, UA: NONE SEEN
GLUCOSE, UA: NEGATIVE mg/dL
Leukocytes, UA: NEGATIVE
Nitrite: NEGATIVE
Protein, ur: 30 mg/dL — AB
SQUAMOUS EPITHELIAL / LPF: NONE SEEN
Specific Gravity, Urine: 1.023 (ref 1.005–1.030)
pH: 6 (ref 5.0–8.0)

## 2015-12-21 LAB — CBC
HCT: 47.3 % (ref 40.0–52.0)
HEMOGLOBIN: 16.7 g/dL (ref 13.0–18.0)
MCH: 32.4 pg (ref 26.0–34.0)
MCHC: 35.3 g/dL (ref 32.0–36.0)
MCV: 91.6 fL (ref 80.0–100.0)
PLATELETS: 228 10*3/uL (ref 150–440)
RBC: 5.16 MIL/uL (ref 4.40–5.90)
RDW: 12.9 % (ref 11.5–14.5)
WBC: 7.1 10*3/uL (ref 3.8–10.6)

## 2015-12-21 MED ORDER — KETOROLAC TROMETHAMINE 30 MG/ML IJ SOLN
30.0000 mg | Freq: Once | INTRAMUSCULAR | Status: DC
  Filled 2015-12-21: qty 1

## 2015-12-21 MED ORDER — ONDANSETRON HCL 4 MG/2ML IJ SOLN
INTRAMUSCULAR | Status: AC
  Filled 2015-12-21: qty 2

## 2015-12-21 MED ORDER — TAMSULOSIN HCL 0.4 MG PO CAPS
0.4000 mg | ORAL_CAPSULE | Freq: Every day | ORAL | Status: DC
  Administered 2015-12-21 – 2015-12-22 (×2): 0.4 mg via ORAL
  Filled 2015-12-21 (×2): qty 1

## 2015-12-21 MED ORDER — ONDANSETRON HCL 4 MG/2ML IJ SOLN
4.0000 mg | Freq: Once | INTRAMUSCULAR | Status: AC
  Administered 2015-12-21: 4 mg via INTRAVENOUS

## 2015-12-21 MED ORDER — ACETAMINOPHEN 325 MG PO TABS
650.0000 mg | ORAL_TABLET | Freq: Four times a day (QID) | ORAL | Status: DC | PRN
  Administered 2015-12-22: 650 mg via ORAL
  Filled 2015-12-21: qty 2

## 2015-12-21 MED ORDER — ZOLPIDEM TARTRATE 5 MG PO TABS
5.0000 mg | ORAL_TABLET | Freq: Once | ORAL | Status: AC
  Administered 2015-12-21: 5 mg via ORAL
  Filled 2015-12-21: qty 1

## 2015-12-21 MED ORDER — KETOROLAC TROMETHAMINE 30 MG/ML IJ SOLN: 30.0000 mg | Freq: Four times a day (QID) | INTRAMUSCULAR | Status: DC | PRN

## 2015-12-21 MED ORDER — ONDANSETRON HCL 4 MG/2ML IJ SOLN
4.0000 mg | Freq: Four times a day (QID) | INTRAMUSCULAR | Status: DC | PRN
  Administered 2015-12-21 – 2015-12-22 (×4): 4 mg via INTRAVENOUS
  Filled 2015-12-21 (×5): qty 2

## 2015-12-21 MED ORDER — HYDROMORPHONE HCL 1 MG/ML IJ SOLN
INTRAMUSCULAR | Status: AC
  Administered 2015-12-21: 1 mg via INTRAVENOUS
  Filled 2015-12-21: qty 1

## 2015-12-21 MED ORDER — FENTANYL CITRATE (PF) 100 MCG/2ML IJ SOLN
50.0000 ug | INTRAMUSCULAR | Status: DC | PRN
  Administered 2015-12-21: 50 ug via INTRAVENOUS
  Filled 2015-12-21: qty 2

## 2015-12-21 MED ORDER — MORPHINE SULFATE (PF) 4 MG/ML IV SOLN
4.0000 mg | Freq: Once | INTRAVENOUS | Status: AC
  Administered 2015-12-21: 4 mg via INTRAVENOUS
  Filled 2015-12-21: qty 1

## 2015-12-21 MED ORDER — ACETAMINOPHEN 650 MG RE SUPP: 650.0000 mg | Freq: Four times a day (QID) | RECTAL | Status: DC | PRN

## 2015-12-21 MED ORDER — OXYCODONE-ACETAMINOPHEN 5-325 MG PO TABS
1.0000 | ORAL_TABLET | Freq: Once | ORAL | Status: AC
  Administered 2015-12-21: 1 via ORAL
  Filled 2015-12-21: qty 1

## 2015-12-21 MED ORDER — HYDROMORPHONE HCL 1 MG/ML IJ SOLN
0.5000 mg | INTRAMUSCULAR | Status: DC | PRN
  Administered 2015-12-21 – 2015-12-23 (×8): 0.5 mg via INTRAVENOUS
  Filled 2015-12-21 (×9): qty 1

## 2015-12-21 MED ORDER — FAMOTIDINE 20 MG PO TABS
20.0000 mg | ORAL_TABLET | Freq: Every day | ORAL | Status: DC
  Administered 2015-12-22 – 2015-12-23 (×3): 20 mg via ORAL
  Filled 2015-12-21 (×3): qty 1

## 2015-12-21 MED ORDER — ONDANSETRON HCL 4 MG/2ML IJ SOLN
INTRAMUSCULAR | Status: AC
  Administered 2015-12-21: 4 mg via INTRAVENOUS
  Filled 2015-12-21: qty 2

## 2015-12-21 MED ORDER — SODIUM CHLORIDE 0.9 % IV SOLN
INTRAVENOUS | Status: DC
  Administered 2015-12-21 – 2015-12-23 (×3): via INTRAVENOUS

## 2015-12-21 MED ORDER — SODIUM CHLORIDE 0.9 % IV BOLUS (SEPSIS)
1000.0000 mL | Freq: Once | INTRAVENOUS | Status: AC
  Administered 2015-12-21: 1000 mL via INTRAVENOUS

## 2015-12-21 MED ORDER — OXYCODONE HCL 5 MG PO TABS
5.0000 mg | ORAL_TABLET | ORAL | Status: DC | PRN
  Administered 2015-12-21 – 2015-12-23 (×5): 5 mg via ORAL
  Filled 2015-12-21 (×5): qty 1

## 2015-12-21 MED ORDER — ENOXAPARIN SODIUM 40 MG/0.4ML ~~LOC~~ SOLN
40.0000 mg | SUBCUTANEOUS | Status: DC
  Administered 2015-12-21 – 2015-12-22 (×2): 40 mg via SUBCUTANEOUS
  Filled 2015-12-21 (×2): qty 0.4

## 2015-12-21 MED ORDER — ONDANSETRON HCL 4 MG PO TABS: 4.0000 mg | ORAL_TABLET | Freq: Four times a day (QID) | ORAL | Status: DC | PRN

## 2015-12-21 MED ORDER — HYDROMORPHONE HCL 1 MG/ML IJ SOLN
1.0000 mg | INTRAMUSCULAR | Status: AC
  Administered 2015-12-21: 1 mg via INTRAVENOUS

## 2015-12-21 MED ORDER — FENTANYL CITRATE (PF) 100 MCG/2ML IJ SOLN
INTRAMUSCULAR | Status: AC
  Administered 2015-12-21: 50 ug via INTRAVENOUS
  Filled 2015-12-21: qty 2

## 2015-12-21 NOTE — ED Notes (Signed)
Charge RN at bedside to assist patient.

## 2015-12-21 NOTE — H&P (Signed)
[facility] [place] [facility]   PATIENT NAME: Elijah ColumbiaJoseph Rickert    MR#:  [id number]  DATE OF BIRTH:  01/22/1966   DATE OF ADMISSION:  12/21/2015  PRIMARY CARE PHYSICIAN: No PCP Per Patient   REQUESTING/REFERRING PHYSICIAN: Schaevitz  CHIEF COMPLAINT:   Chief Complaint  Patient presents with  . Flank Pain    HISTORY OF PRESENT ILLNESS:  Elijah Reyes  is a 50 y.o. male with a known history of Nephrolithiasis who is presenting with flank pain. Describes acute onset left-sided flank pain radiation to the groin sharp 10/10 no worsening or relieving factors.  PAST MEDICAL HISTORY:   Past Medical History:  Diagnosis Date  . Arthritis   . Kidney stone   . Pneumonia     PAST SURGICAL HISTORY:   Past Surgical History:  Procedure Laterality Date  . LITHOTRIPSY    . NOSE SURGERY      SOCIAL HISTORY:   Social History  Substance Use Topics  . Smoking status: Current Every Day Smoker    Types: Cigarettes  . Smokeless tobacco: Never Used  . Alcohol use No    FAMILY HISTORY:   Family History  Problem Relation Age of Onset  . Diabetes Neg Hx     DRUG ALLERGIES:   Allergies  Allergen Reactions  . Erythromycin Nausea Only and Other (See Comments)    Severe stomach cramps  . Ibuprofen Other (See Comments)    heartburn  . Naproxen Other (See Comments)    heartburn    REVIEW OF SYSTEMS:  REVIEW OF SYSTEMS:  CONSTITUTIONAL: Denies fevers, chills, fatigue, weakness.  EYES: Denies blurred vision, double vision, or eye pain.  EARS, NOSE, THROAT: Denies tinnitus, ear pain, hearing loss.  RESPIRATORY: denies cough, shortness of breath, wheezing  CARDIOVASCULAR: Denies chest pain, palpitations, edema.  GASTROINTESTINAL: Denies nausea, vomiting, diarrhea, abdominal pain.  GENITOURINARY: Denies dysuria, hematuria.  ENDOCRINE: Denies nocturia or thyroid problems. HEMATOLOGIC AND LYMPHATIC: Denies easy bruising or bleeding.  SKIN: Denies rash or  lesions.  MUSCULOSKELETAL: Denies pain in neck, back, shoulder, knees, hips, or further arthritic symptoms.  NEUROLOGIC: Denies paralysis, paresthesias.  PSYCHIATRIC: Denies anxiety or depressive symptoms. Otherwise full review of systems performed by me is negative.   MEDICATIONS AT HOME:   Prior to Admission medications   Medication Sig Start Date End Date Taking? Authorizing Provider  ranitidine (ZANTAC) 150 MG tablet Take 450 mg by mouth 2 (two) times daily.   Yes Historical Provider, MD      VITAL SIGNS:  Blood pressure (!) 163/83, pulse 66, temperature 97.6 F (36.4 C), temperature source Oral, resp. rate 16, height 6' (1.829 m), weight 95.3 kg (210 lb), SpO2 98 %.  PHYSICAL EXAMINATION:  VITAL SIGNS: Vitals:   12/21/15 1430 12/21/15 1921  BP: 139/76 (!) 163/83  Pulse:  66  Resp:  16  Temp:     GENERAL:49 y.o.male currently in no acute distress.  HEAD: Normocephalic, atraumatic.  EYES: Pupils equal, round, reactive to light. Extraocular muscles intact. No scleral icterus.  MOUTH: Moist mucosal membrane. Dentition intact. No abscess noted.  EAR, NOSE, THROAT: Clear without exudates. No external lesions.  NECK: Supple. No thyromegaly. No nodules. No JVD.  PULMONARY: Clear to ascultation, without wheeze rails or rhonci. No use of accessory muscles, Good respiratory effort. good air entry bilaterally CHEST: Nontender to palpation.  CARDIOVASCULAR: S1 and S2. Regular rate and rhythm. No murmurs, rubs, or gallops. No edema. Pedal pulses 2+ bilaterally.  GASTROINTESTINAL: Positive  left-sided CVA tenderness Soft, nontender, nondistended. No masses. Positive bowel sounds. No hepatosplenomegaly.  MUSCULOSKELETAL: No swelling, clubbing, or edema. Range of motion full in all extremities.  NEUROLOGIC: Cranial nerves II through XII are intact. No gross focal neurological deficits. Sensation intact. Reflexes intact.  SKIN: No ulceration, lesions, rashes, or cyanosis. Skin warm and dry.  Turgor intact.  PSYCHIATRIC: Mood, affect within normal limits. The patient is awake, alert and oriented x 3. Insight, judgment intact.    LABORATORY PANEL:   CBC  Recent Labs Lab 12/21/15 1228  WBC 7.1  HGB 16.7  HCT 47.3  PLT 228   ------------------------------------------------------------------------------------------------------------------  Chemistries   Recent Labs Lab 12/21/15 1340  NA 137  K 4.3  CL 106  CO2 20*  GLUCOSE 142*  BUN 10  CREATININE 1.08  CALCIUM 9.1   ------------------------------------------------------------------------------------------------------------------  Cardiac Enzymes No results for input(s): TROPONINI in the last 168 hours. ------------------------------------------------------------------------------------------------------------------  RADIOLOGY:  Ct Renal Stone Study  Result Date: 12/21/2015 CLINICAL DATA:  Acute LEFT flank pain this morning with nausea and vomiting, history of kidney stones, he smoker smoking EXAM: CT ABDOMEN AND PELVIS WITHOUT CONTRAST TECHNIQUE: Multidetector CT imaging of the abdomen and pelvis was performed following the standard protocol without IV contrast. Sagittal and coronal MPR images reconstructed from axial data set. Oral contrast not administered for this indication. COMPARISON:  None FINDINGS: Lower chest: Lung bases clear Hepatobiliary: Normal appearance Pancreas: Normal appearance Spleen: Minimally enlarged 16.2 x 5.2 x 11.4 cm (volume = 500 cm3). No focal abnormality. Adrenals/Urinary Tract: Adrenal glands normal appearance. Tiny nonobstructing calculus inferior pole RIGHT kidney. Tiny nonobstructing calculus mid LEFT kidney image 31. Additionally, mild LEFT hydronephrosis and hydroureter terminating at a 2 mm distal LEFT ureteral calculus image 74. Mild enlargement of LEFT kidney with mild perinephric and peripelvic edema. Bladder unremarkable. Stomach/Bowel: Normal appendix. Small collapsed hiatal  hernia. Stomach and bowel loops otherwise grossly unremarkable for technique. Vascular/Lymphatic: Aorta normal caliber. Few scattered pelvic phleboliths. No adenopathy. Reproductive: N/A Other: No free air or free fluid.  No inguinal or ventral hernia. Musculoskeletal: 5 mm anterolisthesis degenerative disc disease changes at L5-S1 with BILATERAL spondylolysis of L5 noted. No acute osseous findings. IMPRESSION: Tiny BILATERAL nonobstructing renal calculi. LEFT hydronephrosis and hydroureter secondary to a 2 mm distal LEFT ureteral calculus. Minimal splenomegaly. 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 Electronically Signed   By: Ulyses SouthwardMark  Boles M.D.   On: 12/21/2015 15:17    EKG:   Orders placed or performed during the hospital encounter of 09/15/15  . ED EKG  . ED EKG  . ED EKG  . ED EKG  . EKG 12-Lead  . EKG 12-Lead  . EKG 12-Lead  . EKG 12-Lead  . EKG    IMPRESSION AND PLAN:   50 year old male history of nephrolithiasis presenting with flank pain  1. Intractable pain/nephrolithiasis 2 mm stone provide pain medications Supportive Measures, urology consult, start Flomax Patient refuses to receive Toradol for pain medication    All the records are reviewed and case discussed with ED provider. Management plans discussed with the patient, family and they are in agreement.  CODE STATUS: Full  TOTAL TIME TAKING CARE OF THIS PATIENT: 33 minutes.    Fawnda Vitullo,  Mardi MainlandDavid K M.D on 12/21/2015 at 7:36 PM  Between 7am to 6pm - Pager - [phone]  After 6pm: House Pager: - [phone]  [facility] [place] [facility]  Office  [phone]  CC: Primary care physician;  No PCP Per Patient

## 2015-12-21 NOTE — ED Notes (Signed)
Pt calling out, into room and patient has vomited green bile on floor despite having emesis bag empty in hand. Explained to patient that he just received pain mediation. Awaiting CT.

## 2015-12-21 NOTE — ED Notes (Signed)
Pt assisted to BR, stated he was unable to urinate or give sample at this time.  Pt reconnected to IVF.

## 2015-12-21 NOTE — ED Triage Notes (Signed)
Pt c/o left flank pain that started about 1hr PTA with nausea.. Pt is moaning in pain.. States he has a hx of kidney stones..Marland Kitchen

## 2015-12-21 NOTE — ED Notes (Signed)
Pt called out reporting he continues to be in pain and he can not wait for medication to take effect PO. Pt states " If morphine didn't help I don't think percocet will"

## 2015-12-21 NOTE — ED Provider Notes (Signed)
[facility] Emergency Department Provider Note  ____________________________________________   None    (approximate)  I have reviewed the triage vital signs and the nursing notes.   HISTORY  Chief Complaint Flank Pain   HPI Elijah Reyes is a 50 y.o. male with a history of kidney stones was presenting with sudden onset left flank pain that started 1 hour prior to arrival. He says the pain is sharp and severe and is radiating into his pelvis. He says that he has needed a lithotripsy in the past but [place]. He says that he has had nausea and vomiting. Also with left-sided flank pain. Says that this feels similar to previous kidney stones.   Past Medical History:  Diagnosis Date  . Arthritis   . Kidney stone   . Pneumonia     There are no active problems to display for this patient.   Past Surgical History:  Procedure Laterality Date  . LITHOTRIPSY    . NOSE SURGERY      Prior to Admission medications   Medication Sig Start Date End Date Taking? Authorizing Provider  ranitidine (ZANTAC) 150 MG tablet Take 450 mg by mouth 2 (two) times daily.   Yes Historical Provider, MD    Allergies Erythromycin; Ibuprofen; and Naproxen  No family history on file.  Social History Social History  Substance Use Topics  . Smoking status: Current Every Day Smoker    Types: Cigarettes  . Smokeless tobacco: Never Used  . Alcohol use No    Review of Systems Constitutional: No fever/chills Eyes: No visual changes. ENT: No sore throat. Cardiovascular: Denies chest pain. Respiratory: Denies shortness of breath. Gastrointestinal:  No diarrhea.  No constipation. Genitourinary: Negative for dysuria. Musculoskeletal: As above Skin: Negative for rash. Neurological: Negative for headaches, focal weakness or numbness.  10-point ROS otherwise negative.  ____________________________________________   PHYSICAL EXAM:  VITAL SIGNS: ED  Triage Vitals  Enc Vitals Group     BP 12/21/15 1225 (!) 158/90     Pulse Rate 12/21/15 1225 60     Resp 12/21/15 1225 18     Temp 12/21/15 1225 97.6 F (36.4 C)     Temp Source 12/21/15 1225 Oral     SpO2 12/21/15 1225 100 %     Weight 12/21/15 1226 210 lb (95.3 kg)     Height 12/21/15 1226 6' (1.829 m)     Head Circumference --      Peak Flow --      Pain Score 12/21/15 1226 10     Pain Loc --      Pain Edu? --      Excl. in GC? --     Constitutional: Alert and oriented. Rolling around bed. Appears to have difficulty finding a position of comfort. Eyes: Conjunctivae are normal. PERRL. EOMI. Head: Atraumatic. Nose: No congestion/rhinnorhea. Mouth/Throat: Mucous membranes are moist.  Neck: No stridor.   Cardiovascular: Normal rate, regular rhythm. Grossly normal heart sounds.   Respiratory: Normal respiratory effort.  No retractions. Lungs CTAB. Gastrointestinal: Left upper as well as left lower and suprapubic tenderness palpation which is moderate. Also with left CVA tenderness palpation. Musculoskeletal: No lower extremity tenderness nor edema.  No joint effusions. Neurologic:  Normal speech and language. No gross focal neurologic deficits are appreciated.  Skin:  Skin is warm, dry and intact. No rash noted. Psychiatric: Mood and affect are normal. Speech and behavior are normal.  ____________________________________________   LABS (all labs  ordered are listed, but only abnormal results are displayed)  Labs Reviewed  URINALYSIS COMPLETEWITH MICROSCOPIC ([facility] ONLY) - Abnormal; Notable for the following:       Result Value   Color, Urine AMBER (*)    APPearance CLEAR (*)    Ketones, ur 1+ (*)    Hgb urine dipstick 1+ (*)    Protein, ur 30 (*)    All other components within normal limits  BASIC METABOLIC PANEL - Abnormal; Notable for the following:    CO2 20 (*)    Glucose, Bld 142 (*)    All other components within normal limits  CBC    ____________________________________________  EKG   ____________________________________________  RADIOLOGY   CT RENAL STONE STUDY (Final result)  Result time 12/21/15 15:17:09  Final result by Ulyses SouthwardMark Boles, MD (12/21/15 15:17:09)           Narrative:   CLINICAL DATA: Acute LEFT flank pain this morning with nausea and vomiting, history of kidney stones, he smoker smoking  EXAM: CT ABDOMEN AND PELVIS WITHOUT CONTRAST  TECHNIQUE: Multidetector CT imaging of the abdomen and pelvis was performed following the standard protocol without IV contrast. Sagittal and coronal MPR images reconstructed from axial data set. Oral contrast not administered for this indication.  COMPARISON: None  FINDINGS: Lower chest: Lung bases clear  Hepatobiliary: Normal appearance  Pancreas: Normal appearance  Spleen: Minimally enlarged 16.2 x 5.2 x 11.4 cm (volume = 500 cm3). No focal abnormality.  Adrenals/Urinary Tract: Adrenal glands normal appearance. Tiny nonobstructing calculus inferior pole RIGHT kidney. Tiny nonobstructing calculus mid LEFT kidney image 31. Additionally, mild LEFT hydronephrosis and hydroureter terminating at a 2 mm distal LEFT ureteral calculus image 74. Mild enlargement of LEFT kidney with mild perinephric and peripelvic edema. Bladder unremarkable.  Stomach/Bowel: Normal appendix. Small collapsed hiatal hernia. Stomach and bowel loops otherwise grossly unremarkable for technique.  Vascular/Lymphatic: Aorta normal caliber. Few scattered pelvic phleboliths. No adenopathy.  Reproductive: N/A  Other: No free air or free fluid. No inguinal or ventral hernia.  Musculoskeletal: 5 mm anterolisthesis degenerative disc disease changes at L5-S1 with BILATERAL spondylolysis of L5 noted. No acute osseous findings.  IMPRESSION: Tiny BILATERAL nonobstructing renal calculi.  LEFT hydronephrosis and hydroureter secondary to a 2 mm distal LEFT ureteral  calculus.  Minimal splenomegaly.  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   Electronically Signed By: Ulyses SouthwardMark Boles M.D. On: 12/21/2015 15:17           ____________________________________________   PROCEDURES  Procedure(s) performed:   Procedures  Critical Care performed:   ____________________________________________   INITIAL IMPRESSION / ASSESSMENT AND PLAN / ED COURSE  Pertinent labs & imaging results that were available during my care of the patient were reviewed by me and considered in my medical decision making (see chart for details).  ----------------------------------------- 6:25 PM on 12/21/2015 -----------------------------------------  Patient has received fentanyl, morphine and Percocet and Dilaudid and still says his pain is a 9-10 out of 10. He vomited about one hour ago. Plan to admit to the hospital. Signed out to Dr. Nemiah CommanderKalisetti and I discussed the plan with the patient. Discussed case with Dr. Ronne BinningMcKenzie recommends straining the patient's urine keeping him nothing by mouth after midnight.  Clinical Course      ____________________________________________   FINAL CLINICAL IMPRESSION(S) / ED DIAGNOSES  Final diagnoses:  Left flank pain   Kidney stone.   NEW MEDICATIONS STARTED DURING THIS VISIT:  New Prescriptions  No medications on file     Note:  This document was prepared using Dragon voice recognition software and may include unintentional dictation errors.    Myrna Blazer, MD 12/21/15 [phone]

## 2015-12-21 NOTE — ED Notes (Signed)
Pt c/o right flank pain 1 hour PTA to ER. Dysuria. Pt vomited green bile while waiting. Pt pale.

## 2015-12-21 NOTE — ED Notes (Signed)
Pt requesting more pain medication. Pt informed that he needs to see MD. Pt has not gone to CT at this time. Pt provided warm blankets and verbal reassurance.

## 2015-12-22 ENCOUNTER — Encounter
Admission: EM | Disposition: A | Discharge status: Home / Self Care | Payer: Self-pay | Source: Home / Self Care | Attending: Emergency Medicine

## 2015-12-22 ENCOUNTER — Observation Stay: Payer: Self-pay | Admitting: Anesthesiology

## 2015-12-22 DIAGNOSIS — N201 Calculus of ureter: Secondary | ICD-10-CM

## 2015-12-22 DIAGNOSIS — R109 Unspecified abdominal pain: Secondary | ICD-10-CM

## 2015-12-22 HISTORY — PX: CYSTOSCOPY/RETROGRADE/URETEROSCOPY: SHX5316

## 2015-12-22 LAB — URINE DRUG SCREEN, QUALITATIVE (ARMC ONLY)
AMPHETAMINES, UR SCREEN: NOT DETECTED
BENZODIAZEPINE, UR SCRN: POSITIVE — AB
Barbiturates, Ur Screen: NOT DETECTED
Cannabinoid 50 Ng, Ur ~~LOC~~: POSITIVE — AB
Cocaine Metabolite,Ur ~~LOC~~: POSITIVE — AB
MDMA (ECSTASY) UR SCREEN: NOT DETECTED
Methadone Scn, Ur: NOT DETECTED
Opiate, Ur Screen: POSITIVE — AB
PHENCYCLIDINE (PCP) UR S: NOT DETECTED
TRICYCLIC, UR SCREEN: NOT DETECTED

## 2015-12-22 LAB — CBC
HCT: 41.1 % (ref 40.0–52.0)
Hemoglobin: 14.3 g/dL (ref 13.0–18.0)
MCH: 32.1 pg (ref 26.0–34.0)
MCHC: 34.9 g/dL (ref 32.0–36.0)
MCV: 91.9 fL (ref 80.0–100.0)
PLATELETS: 171 10*3/uL (ref 150–440)
RBC: 4.47 MIL/uL (ref 4.40–5.90)
RDW: 12.9 % (ref 11.5–14.5)
WBC: 12 10*3/uL — AB (ref 3.8–10.6)

## 2015-12-22 LAB — BASIC METABOLIC PANEL
Anion gap: 9 (ref 5–15)
BUN: 16 mg/dL (ref 6–20)
CALCIUM: 8.8 mg/dL — AB (ref 8.9–10.3)
CO2: 22 mmol/L (ref 22–32)
CREATININE: 1.5 mg/dL — AB (ref 0.61–1.24)
Chloride: 107 mmol/L (ref 101–111)
GFR, EST NON AFRICAN AMERICAN: 54 mL/min — AB (ref >60)
Glucose, Bld: 107 mg/dL — ABNORMAL HIGH (ref 65–99)
Potassium: 3.5 mmol/L (ref 3.5–5.1)
SODIUM: 138 mmol/L (ref 135–145)

## 2015-12-22 SURGERY — CYSTOSCOPY/RETROGRADE/URETEROSCOPY
Anesthesia: General | Laterality: Left

## 2015-12-22 MED ORDER — KETOROLAC TROMETHAMINE 30 MG/ML IJ SOLN
30.0000 mg | Freq: Once | INTRAMUSCULAR | Status: AC
  Administered 2015-12-22: 30 mg via INTRAVENOUS
  Filled 2015-12-22: qty 1

## 2015-12-22 MED ORDER — CEFAZOLIN SODIUM-DEXTROSE 2-3 GM-% IV SOLR
INTRAVENOUS | Status: DC | PRN
  Administered 2015-12-22: 2 g via INTRAVENOUS

## 2015-12-22 MED ORDER — SENNOSIDES-DOCUSATE SODIUM 8.6-50 MG PO TABS
1.0000 | ORAL_TABLET | Freq: Every day | ORAL | Status: DC
  Administered 2015-12-22: 1 via ORAL
  Filled 2015-12-22: qty 1

## 2015-12-22 MED ORDER — BELLADONNA ALKALOIDS-OPIUM 16.2-60 MG RE SUPP
RECTAL | Status: DC | PRN
  Administered 2015-12-22: 1 via RECTAL

## 2015-12-22 MED ORDER — FENTANYL CITRATE (PF) 100 MCG/2ML IJ SOLN: 25.0000 ug | INTRAMUSCULAR | Status: DC | PRN

## 2015-12-22 MED ORDER — SODIUM CHLORIDE 0.9 % IV SOLN
INTRAVENOUS | Status: DC | PRN
  Administered 2015-12-22: 18:00:00 via INTRAVENOUS

## 2015-12-22 MED ORDER — BELLADONNA ALKALOIDS-OPIUM 16.2-60 MG RE SUPP
RECTAL | Status: AC
  Filled 2015-12-22: qty 1

## 2015-12-22 MED ORDER — FENTANYL CITRATE (PF) 100 MCG/2ML IJ SOLN
INTRAMUSCULAR | Status: DC | PRN
  Administered 2015-12-22 (×2): 50 ug via INTRAVENOUS

## 2015-12-22 MED ORDER — CEFAZOLIN (ANCEF) 1 G IV SOLR
2.0000 g | INTRAVENOUS | Status: DC
  Filled 2015-12-22: qty 2

## 2015-12-22 MED ORDER — ONDANSETRON HCL 4 MG/2ML IJ SOLN: 4.0000 mg | Freq: Once | INTRAMUSCULAR | Status: DC | PRN

## 2015-12-22 MED ORDER — LIDOCAINE HCL 2 % EX GEL
CUTANEOUS | Status: DC | PRN
  Administered 2015-12-22: 1 via URETHRAL

## 2015-12-22 MED ORDER — LIDOCAINE HCL 2 % EX GEL
CUTANEOUS | Status: AC
  Filled 2015-12-22: qty 10

## 2015-12-22 MED ORDER — MIDAZOLAM HCL 5 MG/5ML IJ SOLN
INTRAMUSCULAR | Status: DC | PRN
  Administered 2015-12-22: 2 mg via INTRAVENOUS

## 2015-12-22 MED ORDER — PROPOFOL 10 MG/ML IV BOLUS
INTRAVENOUS | Status: DC | PRN
  Administered 2015-12-22: 20 mg via INTRAVENOUS
  Administered 2015-12-22: 180 mg via INTRAVENOUS

## 2015-12-22 MED ORDER — ZOLPIDEM TARTRATE 5 MG PO TABS
5.0000 mg | ORAL_TABLET | Freq: Once | ORAL | Status: AC
  Administered 2015-12-22: 5 mg via ORAL
  Filled 2015-12-22: qty 1

## 2015-12-22 MED ORDER — LIDOCAINE HCL (CARDIAC) 20 MG/ML IV SOLN
INTRAVENOUS | Status: DC | PRN
  Administered 2015-12-22: 30 mg via INTRAVENOUS

## 2015-12-22 MED ORDER — KETOROLAC TROMETHAMINE 30 MG/ML IJ SOLN
30.0000 mg | Freq: Four times a day (QID) | INTRAMUSCULAR | Status: DC | PRN
  Administered 2015-12-22 (×2): 30 mg via INTRAVENOUS
  Filled 2015-12-22 (×2): qty 1

## 2015-12-22 MED ORDER — HYDROMORPHONE HCL 1 MG/ML IJ SOLN
0.5000 mg | Freq: Once | INTRAMUSCULAR | Status: AC
  Administered 2015-12-22: 0.5 mg via INTRAVENOUS
  Filled 2015-12-22: qty 1

## 2015-12-22 SURGICAL SUPPLY — 26 items
BAG DRAIN CYSTO-URO LG1000N (MISCELLANEOUS) ×4 IMPLANT
CATH URETL 5X70 OPEN END (CATHETERS) IMPLANT
CONRAY 43 FOR UROLOGY 50M (MISCELLANEOUS) ×4 IMPLANT
DRSG TEGADERM 2X2.25 PEDS (GAUZE/BANDAGES/DRESSINGS) ×4 IMPLANT
EXTRACTOR STONE NITINOL NGAGE (UROLOGICAL SUPPLIES) ×4 IMPLANT
FEE TECHNICIAN ONLY PER HOUR (MISCELLANEOUS) IMPLANT
GLOVE BIO SURGEON STRL SZ7 (GLOVE) ×8 IMPLANT
GLOVE BIO SURGEON STRL SZ7.5 (GLOVE) ×4 IMPLANT
GOWN STRL REUS W/ TWL LRG LVL3 (GOWN DISPOSABLE) ×4 IMPLANT
GOWN STRL REUS W/ TWL XL LVL3 (GOWN DISPOSABLE) ×2 IMPLANT
GOWN STRL REUS W/TWL LRG LVL3 (GOWN DISPOSABLE) ×4
GOWN STRL REUS W/TWL XL LVL3 (GOWN DISPOSABLE) ×2
KIT RM TURNOVER CYSTO AR (KITS) ×4 IMPLANT
LASER HOLMIUM FIBER SU 272UM (MISCELLANEOUS) IMPLANT
PACK CYSTO AR (MISCELLANEOUS) ×4 IMPLANT
PREP PVP WINGED SPONGE (MISCELLANEOUS) ×4 IMPLANT
SENSORWIRE 0.038 NOT ANGLED (WIRE)
SET CYSTO W/LG BORE CLAMP LF (SET/KITS/TRAYS/PACK) ×4 IMPLANT
SOL .9 NS 3000ML IRR  AL (IV SOLUTION) ×2
SOL .9 NS 3000ML IRR UROMATIC (IV SOLUTION) ×2 IMPLANT
STENT URET 6FRX24 CONTOUR (STENTS) IMPLANT
STENT URET 6FRX26 CONTOUR (STENTS) IMPLANT
SURGILUBE 2OZ TUBE FLIPTOP (MISCELLANEOUS) ×4 IMPLANT
SWABSTK COMLB BENZOIN TINCTURE (MISCELLANEOUS) ×4 IMPLANT
WATER STERILE IRR 1000ML POUR (IV SOLUTION) ×4 IMPLANT
WIRE SENSOR 0.038 NOT ANGLED (WIRE) IMPLANT

## 2015-12-22 NOTE — Op Note (Signed)
Preoperative diagnosis:  1. Small left distal ureteral stone   Postoperative diagnosis:  1. Same   Procedure: 1. Cystoscopy, retrograde pyelogram (left) with interpretation 2. Left ureteroscopy, stone basket extraction  Surgeon: Ardis Hughs, MD  Anesthesia: General  Complications: None  Intraoperative findings:  #1: The patient's left-sided retrograde pyelogram was performed using a 5 [place] open-ended ureteral catheter and 10 mL on the precontrast. This demonstrated a filling defect in the patient's left distal ureter consistent with the small stone. There was mild to moderate proximal hydroureteronephrosis. #2: I was able to grasp the stone with Ngage basket and remove the stone intact without laser fragmentation. #3: No ureteral stent was left given the atraumatic stone extraction.  EBL: Minimal  Specimens: None  Indication: Elijah Reyes is a 50 y.o. patient with history of left nephrolithiasis with very difficult pain control issues.  After reviewing the management options for treatment, he elected to proceed with the above surgical procedure(s). We have discussed the potential benefits and risks of the procedure, side effects of the proposed treatment, the likelihood of the patient achieving the goals of the procedure, and any potential problems that might occur during the procedure or recuperation. Informed consent has been obtained.  Description of procedure:  The patient was taken to the operating room and general anesthesia was induced.  The patient was placed in the dorsal lithotomy position, prepped and draped in the usual sterile fashion, and preoperative antibiotics were administered. A preoperative time-out was performed.   A 21 French 30 cystoscope was visually passed into the patient's urethra and in the bladder. A 30 and 60 cystoscopic evaluation was performed with no significant abnormalities. There were no bladder mucosa abnormalities, ureteral orifices  were orthotopic, there was no stones or masses within the bladder.  I then attempted to pass a 0.38 sensor wire through the cystoscope and into the left kidney but met resistance in the distal ureter likely from an impacted stone. I subsequently intubated the left ureteral orifice and performed a retrograde pyelogram the above findings. I then removed the cystoscope and very carefully passed a 4/5 [place] semi-euro rigid ureteroscope through the patient's urethra and into the bladder under visual guidance. I then cannulated the left ureter and encountered the stone in the distal ureter. I then used a stone basket to remove the stone intact without laser fragmentation. The stone was easily extricated.  I then reintroduced the cystoscope and injected more contrast into the patient's left ureter to ensure adequate drainage. The ureter was noted to be normal caliber its time and the drainage was brisk. As such, decision was made not to leave a ureteral stent. The bladder was subsequently emptied. I can jelly was then injected into the patient's urethra. A B and O suppository was inserted into the patient's rectum. He subsequently extubated and returned to PACU in stable condition.  Ardis Hughs, M.D.

## 2015-12-22 NOTE — Plan of Care (Signed)
Problem: Fluid Volume: Goal: Ability to maintain a balanced intake and output will improve Outcome: Not Progressing Patient is NPO.  Problem: Nutrition: Goal: Adequate nutrition will be maintained Outcome: Not Progressing Patient is NPO.   

## 2015-12-22 NOTE — Progress Notes (Signed)
Called Dr. Sheryle Hailiamond regarding patient request for toradol because it really helped relieve his pain.  Patient already has dilaudid q4 and oxycodone q4 for pain.  Doctor opted against this request but will give patient medication for heartburn b/c toradol gives him heartburn.  Patient already has pepcid ordered in am.  Patient is resting with a pain level of 3.  Elijah Reyes, Elijah Reyes N   12/22/2015  3:22 AM

## 2015-12-22 NOTE — Discharge Instructions (Signed)
DISCHARGE INSTRUCTIONS FOR KIDNEY STONE/URETERAL STENT   All stones were removed from the left kidney.  No ureteral stent was left behind.  MEDICATIONS:  1.  Resume all your other meds from home - except do not take any extra narcotic pain meds that you may have at home.    ACTIVITY:  1. No strenuous activity x 1week  2. No driving while on narcotic pain medications  3. Drink plenty of water  4. Continue to walk at home - you can still get blood clots when you are at home, so keep active, but don't over do it.  5. May return to work/school tomorrow or when you feel ready   BATHING:  1. You can shower and we recommend daily showers    SIGNS/SYMPTOMS TO CALL:  Please call us if you have a fever greater than 101.5, uncontrolled nausea/vomiting, uncontrolled pain, dizziness, unable to urinate, bloody urine, chest pain, shortness of breath, leg swelling, leg pain, redness around wound, drainage from wound, or any other concerns or questions.   You can reach us at [phone].   FOLLOW-UP:  1. Call to schedule an appointment with [facility] if you have any additional questions or concerns.

## 2015-12-22 NOTE — Progress Notes (Signed)
The patient is status post left ureteroscopy with stone basket extraction. There is no ureteral stent left behind. The patient has no ongoing hydronephrosis based on intraoperative imaging, and as a result he should have complete resolution of his pain.  The patient can be discharged once he is met discharge criteria at the hospitalist discretion. He will follow-up with [facility] as needed.  Discharge instructions and contact information have been provided for the patient.  Please contact the urologist on call for any additional questions/concerns.

## 2015-12-22 NOTE — Progress Notes (Signed)
[employer] [facility] [place] [facility]   PATIENT NAME: Elijah Reyes    MR#:  [id number]  DATE OF BIRTH:  04-01-1965  SUBJECTIVE:   Patient here due to left flank pain and noted to have a small left 2 mm ureteral stone. Still having significant pain. Seen by urology and plan for ureteroscopy later today.  REVIEW OF SYSTEMS:    Review of Systems  Constitutional: Negative for chills and fever.  HENT: Negative for congestion and tinnitus.   Eyes: Negative for blurred vision and double vision.  Respiratory: Negative for cough, shortness of breath and wheezing.   Cardiovascular: Negative for chest pain, orthopnea and PND.  Gastrointestinal: Positive for abdominal pain (Left flank). Negative for diarrhea, nausea and vomiting.  Genitourinary: Negative for dysuria and hematuria.  Neurological: Negative for dizziness, sensory change and focal weakness.  All other systems reviewed and are negative.   Nutrition: Clear liquids Tolerating Diet: Yes Tolerating PT: Ambulatory     DRUG ALLERGIES:   Allergies  Allergen Reactions  . Erythromycin Nausea Only and Other (See Comments)    Severe stomach cramps  . Ibuprofen Other (See Comments)    heartburn  . Naproxen Other (See Comments)    heartburn    VITALS:  Blood pressure (!) 124/56, pulse (!) 50, temperature 98.1 F (36.7 C), temperature source Oral, resp. rate 20, height 6' (1.829 m), weight 89.4 kg (197 lb 3.2 oz), SpO2 98 %.  PHYSICAL EXAMINATION:   Physical Exam  GENERAL:  50 y.o.-year-old patient lying in the bed in mild distress.  EYES: Pupils equal, round, reactive to light and accommodation. No scleral icterus. Extraocular muscles intact.  HEENT: Head atraumatic, normocephalic. Oropharynx and nasopharynx clear.  NECK:  Supple, no jugular venous distention. No thyroid enlargement, no tenderness.  LUNGS: Normal breath sounds bilaterally, no wheezing, rales, rhonchi. No use of accessory muscles of  respiration.  CARDIOVASCULAR: S1, S2 normal. No murmurs, rubs, or gallops.  ABDOMEN: Soft, Tender in right flank, no rebound, rigidity, nondistended. Bowel sounds present. No organomegaly or mass.  EXTREMITIES: No cyanosis, clubbing or edema b/l.    NEUROLOGIC: Cranial nerves II through XII are intact. No focal Motor or sensory deficits b/l.   PSYCHIATRIC: The patient is alert and oriented x 3.  SKIN: No obvious rash, lesion, or ulcer.    LABORATORY PANEL:   CBC  Recent Labs Lab 12/22/15 0504  WBC 12.0*  HGB 14.3  HCT 41.1  PLT 171   ------------------------------------------------------------------------------------------------------------------  Chemistries   Recent Labs Lab 12/22/15 0504  NA 138  K 3.5  CL 107  CO2 22  GLUCOSE 107*  BUN 16  CREATININE 1.50*  CALCIUM 8.8*   ------------------------------------------------------------------------------------------------------------------  Cardiac Enzymes No results for input(s): TROPONINI in the last 168 hours. ------------------------------------------------------------------------------------------------------------------  RADIOLOGY:  Ct Renal Stone Study  Result Date: 12/21/2015 CLINICAL DATA:  Acute LEFT flank pain this morning with nausea and vomiting, history of kidney stones, he smoker smoking EXAM: CT ABDOMEN AND PELVIS WITHOUT CONTRAST TECHNIQUE: Multidetector CT imaging of the abdomen and pelvis was performed following the standard protocol without IV contrast. Sagittal and coronal MPR images reconstructed from axial data set. Oral contrast not administered for this indication. COMPARISON:  None FINDINGS: Lower chest: Lung bases clear Hepatobiliary: Normal appearance Pancreas: Normal appearance Spleen: Minimally enlarged 16.2 x 5.2 x 11.4 cm (volume = 500 cm3). No focal abnormality. Adrenals/Urinary Tract: Adrenal glands normal appearance. Tiny nonobstructing calculus inferior pole RIGHT kidney. Tiny  nonobstructing calculus mid LEFT  kidney image 31. Additionally, mild LEFT hydronephrosis and hydroureter terminating at a 2 mm distal LEFT ureteral calculus image 74. Mild enlargement of LEFT kidney with mild perinephric and peripelvic edema. Bladder unremarkable. Stomach/Bowel: Normal appendix. Small collapsed hiatal hernia. Stomach and bowel loops otherwise grossly unremarkable for technique. Vascular/Lymphatic: Aorta normal caliber. Few scattered pelvic phleboliths. No adenopathy. Reproductive: N/A Other: No free air or free fluid.  No inguinal or ventral hernia. Musculoskeletal: 5 mm anterolisthesis degenerative disc disease changes at L5-S1 with BILATERAL spondylolysis of L5 noted. No acute osseous findings. IMPRESSION: Tiny BILATERAL nonobstructing renal calculi. LEFT hydronephrosis and hydroureter secondary to a 2 mm distal LEFT ureteral calculus. Minimal splenomegaly. 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 Electronically Signed   By: Ulyses SouthwardMark  Boles M.D.   On: 12/21/2015 15:17     ASSESSMENT AND PLAN:   50 year old male with past medical history of nephrolithiasis, osteoarthritis, pneumonia who presents to the hospital due to left-sided flank pain and nausea vomiting.  1. Left-sided nephrolithiasis-this is the cause of patient's pain nausea and vomiting. -Continue supportive care with IV fluids, IV Toradol for pain.  -seen by urology and plan for ureteroscopy later today.  2. Acute kidney injury-secondary to nephrolithiasis. Continue IV fluids and will follow BUN and creatinine.  3. Leukocytosis-secondary to nephrolithiasis.  -follow with treatment as mentioned above   All the records are reviewed and case discussed with Care Management/Social Worker. Management plans discussed with the patient, family and they are in agreement.  CODE STATUS: Full  DVT Prophylaxis: Lovenox  TOTAL TIME TAKING CARE OF THIS PATIENT: 25  minutes.   POSSIBLE D/C IN 1-2 DAYS, DEPENDING ON CLINICAL CONDITION.   Houston SirenSAINANI,Amadu Schlageter J M.D on 12/22/2015 at 2:31 PM  Between 7am to 6pm - Pager - [phone]  After 6pm go to [email or web address] - Scientist, research (life [email or web address])password [email or web address]  [employer]  Hospitalists  Office  [phone]  CC: Primary care physician; No PCP Per Patient

## 2015-12-22 NOTE — Anesthesia Preprocedure Evaluation (Addendum)
Anesthesia Evaluation  Patient identified by MRN, date of birth, ID band Patient awake    Reviewed: Allergy & Precautions, H&P , NPO status , Patient's Chart, lab work & pertinent test results, reviewed documented beta blocker date and time   Airway Mallampati: II  TM Distance: >3 FB Neck ROM: full    Dental  (+) Teeth Intact   Pulmonary neg pulmonary ROS, pneumonia, resolved, Current Smoker,    Pulmonary exam normal        Cardiovascular Exercise Tolerance: Good negative cardio ROS Normal cardiovascular exam Rate:Normal     Neuro/Psych negative neurological ROS  negative psych ROS   GI/Hepatic negative GI ROS, Neg liver ROS,   Endo/Other  negative endocrine ROS  Renal/GU Renal diseasenegative Renal ROS  negative genitourinary   Musculoskeletal  (+) Arthritis ,   Abdominal   Peds  Hematology negative hematology ROS (+)   Anesthesia Other Findings Hx of heroin use 3 days ago.    Reproductive/Obstetrics negative OB ROS                            Anesthesia Physical Anesthesia Plan  ASA: II and emergent  Anesthesia Plan: General LMA   Post-op Pain Management:    Induction:   Airway Management Planned:   Additional Equipment:   Intra-op Plan:   Post-operative Plan:   Informed Consent: I have reviewed the patients History and Physical, chart, labs and discussed the procedure including the risks, benefits and alternatives for the proposed anesthesia with the patient or authorized representative who has indicated his/her understanding and acceptance.     Plan Discussed with: CRNA  Anesthesia Plan Comments:         Anesthesia Quick Evaluation

## 2015-12-22 NOTE — Progress Notes (Signed)
Called Dr. Sheryle Hailiamond regarding pain medication.  Doctor will give a one time order for 0.5 mg of dilaudid and one time order for toradol for 7 am.  Elijah Reyes, [name] N  12/22/2015  4:58 AM

## 2015-12-22 NOTE — Consult Note (Addendum)
Urology Consult  I have been asked to see the patient by Dr. Clint Guy, for evaluation and management of left distal ureteral stone.  Chief Complaint: left flank pain  History of Present Illness: Elijah Reyes is a 50 y.o. year old male who was admitted yesterday to the hospital service with refractory pain from a 2 mm left distal ureteral stone with proximal hydroureteronephrosis.  On admission, there were no signs or symptoms of infection. His UA was negative without leukocytosis. His creatinine was relatively unremarkable but has risen slightly to 1.5 today. He remains afebrile and normotensive.   He continues to have severe pain despite numerous administrations of narcotics and Toradol. 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  He does have associated nausea and vomiting.  He is a personal history of kidney stones. He underwent ureteroscopy in [place] at least 5 years ago.  He does also have baseline erectile dysfunction and attributes this to his previous ureteroscopy/ Foley catheter placement.  He last had [employer] 1145 today. He's not had any solid food since yesterday.  Past Medical History:  Diagnosis Date  . Arthritis   . Kidney stone   . Pneumonia     Past Surgical History:  Procedure Laterality Date  . LITHOTRIPSY    . NOSE SURGERY      Home Medications:  Current Meds  Medication Sig  . ranitidine (ZANTAC) 150 MG tablet Take 450 mg by mouth 2 (two) times daily.    Allergies:  Allergies  Allergen Reactions  . Erythromycin Nausea Only and Other (See Comments)    Severe stomach cramps  . Ibuprofen Other (See Comments)    heartburn  . Naproxen Other (See Comments)    heartburn    Family History  Problem Relation Age of Onset  . Diabetes Neg Hx     Social History:  reports that he has been smoking Cigarettes.  He has never used smokeless tobacco. He reports that he does not drink alcohol or use  drugs.  ROS: A complete review of systems was performed.  All systems are negative except for pertinent findings as noted.  Physical Exam:  Vital signs in last 24 hours: Temp:  [97.6 F (36.4 C)-98.4 F (36.9 C)] 98.1 F (36.7 C) (11/21 0429) Pulse Rate:  [37-66] 50 (11/21 0429) Resp:  [16-22] 20 (11/21 0429) BP: (124-163)/(56-90) 124/56 (11/21 0429) SpO2:  [98 %-100 %] 98 % (11/21 0429) Weight:  [197 lb 3.2 oz (89.4 kg)-210 lb (95.3 kg)] 197 lb 3.2 oz (89.4 kg) (11/20 2019) Constitutional:  Alert and oriented, No acute distress HEENT: [place] AT, moist mucus membranes.  Trachea midline, no masses Cardiovascular: Regular rate and rhythm, no clubbing, cyanosis, or edema. Respiratory: Normal respiratory effort, lungs clear bilaterally GI: Abdomen is soft, nontender, nondistended, no abdominal masses GU: No CVA tenderness. Skin: No rashes, bruises or suspicious lesions Neurologic: Grossly intact, no focal deficits, moving all 4 extremities Psychiatric: Normal mood and affect   Laboratory Data:   Recent Labs  12/21/15 1228 12/22/15 0504  WBC 7.1 12.0*  HGB 16.7 14.3  HCT 47.3 41.1    Recent Labs  12/21/15 1340 12/22/15 0504  NA 137 138  K 4.3 3.5  CL 106 107  CO2 20* 22  GLUCOSE 142* 107*  BUN 10 16  CREATININE 1.08 1.50*  CALCIUM 9.1 8.8*   No results for input(s): LABPT, INR in the last 72 hours. No results for input(s): LABURIN  in the last 72 hours. Results for orders placed or performed during the hospital encounter of 08/28/10  Urine culture     Status: None   Collection Time: 08/28/10  5:51 PM  Result Value Ref Range Status   Specimen Description URINE, CLEAN CATCH  Final   Special Requests NONE  Final   Culture  Setup Time [id number]  Final   Colony Count 2,000 COLONIES/ML  Final   Culture INSIGNIFICANT GROWTH  Final   Report Status 08/29/2010 FINAL  Final   Component     Latest Ref Rng & Units 12/21/2015  Color, Urine     YELLOW AMBER (A)   Appearance     CLEAR CLEAR (A)  Glucose     NEGATIVE mg/dL NEGATIVE  Bilirubin Urine     NEGATIVE NEGATIVE  Ketones, ur     NEGATIVE mg/dL 1+ (A)  Specific Gravity, Urine     1.005 - 1.030 1.023  Hgb urine dipstick     NEGATIVE 1+ (A)  pH     5.0 - 8.0 6.0  Protein     NEGATIVE mg/dL 30 (A)  Nitrite     NEGATIVE NEGATIVE  Leukocytes, UA     NEGATIVE NEGATIVE  RBC / HPF     0 - 5 RBC/hpf 0-5  WBC, UA     0 - 5 WBC/hpf 0-5  Bacteria, UA     NONE SEEN NONE SEEN  Squamous Epithelial / LPF     NONE SEEN NONE SEEN  Mucous      PRESENT  Ca Oxalate Crys, UA      PRESENT     Radiologic Imaging: Ct Renal Stone Study  Result Date: 12/21/2015 CLINICAL DATA:  Acute LEFT flank pain this morning with nausea and vomiting, history of kidney stones, he smoker smoking EXAM: CT ABDOMEN AND PELVIS WITHOUT CONTRAST TECHNIQUE: Multidetector CT imaging of the abdomen and pelvis was performed following the standard protocol without IV contrast. Sagittal and coronal MPR images reconstructed from axial data set. Oral contrast not administered for this indication. COMPARISON:  None FINDINGS: Lower chest: Lung bases clear Hepatobiliary: Normal appearance Pancreas: Normal appearance Spleen: Minimally enlarged 16.2 x 5.2 x 11.4 cm (volume = 500 cm3). No focal abnormality. Adrenals/Urinary Tract: Adrenal glands normal appearance. Tiny nonobstructing calculus inferior pole RIGHT kidney. Tiny nonobstructing calculus mid LEFT kidney image 31. Additionally, mild LEFT hydronephrosis and hydroureter terminating at a 2 mm distal LEFT ureteral calculus image 74. Mild enlargement of LEFT kidney with mild perinephric and peripelvic edema. Bladder unremarkable. Stomach/Bowel: Normal appendix. Small collapsed hiatal hernia. Stomach and bowel loops otherwise grossly unremarkable for technique. Vascular/Lymphatic: Aorta normal caliber. Few scattered pelvic phleboliths. No adenopathy. Reproductive: N/A Other: No free air  or free fluid.  No inguinal or ventral hernia. Musculoskeletal: 5 mm anterolisthesis degenerative disc disease changes at L5-S1 with BILATERAL spondylolysis of L5 noted. No acute osseous findings. IMPRESSION: Tiny BILATERAL nonobstructing renal calculi. LEFT hydronephrosis and hydroureter secondary to a 2 mm distal LEFT ureteral calculus. Minimal splenomegaly. 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 Electronically Signed   By: Ulyses SouthwardMark  Boles M.D.   On: 12/21/2015 15:17   CT scan personally reviewed today.   Impression/Plan:  50 year old male with history of narcotics abuse with severe refractory pain secondary to a 2 mm left distal ureteral stone. Options were discussed today in detail. These include continued medical expulsive therapy with Flomax, IV hydration, and straining his urine. Alternatively,  he was offered ureteroscopy, possible basket extraction, possible laser lithotripsy and stent placement in the setting of refractory pain.  Risk and benefits of each were discussed in detail.  Given his ongoing severe pain, he would like to proceed with ureteroscopy. He was advised to remain nothing by mouth and we can add him onto the OR schedule this afternoon/early evening.  I explained to him that erectile dysfunction is not a side effect of ureteroscopy and his baseline ED is likely not secondary to his previous procedure.  All of his questions were answered.  Procedure to be performed by my partner, Dr. Cordella RegisterBen Chelcey Caputo later this evening.  12/22/2015, 12:10 PM  Vanna ScotlandAshley Brandon,  MD   Addendum: Pain difficult to control - patient anxious and difficult to control.  We'll plan to proceed emergently now to the OR.  R/B to discussed with patient.

## 2015-12-22 NOTE — Anesthesia Procedure Notes (Signed)
Procedure Name: LMA Insertion Performed by: Waldo LaineJUSTIS, Arael Piccione Pre-anesthesia Checklist: Patient identified, Emergency Drugs available, Suction available, Patient being monitored and Timeout performed Patient Re-evaluated:Patient Re-evaluated prior to inductionOxygen Delivery Method: Circle system utilized and Simple face mask Preoxygenation: Pre-oxygenation with 100% oxygen Intubation Type: IV induction Ventilation: Mask ventilation without difficulty LMA: LMA inserted LMA Size: 4.0 Placement Confirmation: positive ETCO2 Dental Injury: Teeth and Oropharynx as per pre-operative assessment

## 2015-12-22 NOTE — Progress Notes (Signed)
Called Dr. Anne HahnWillis regarding more pain medication per patient request.  Appropriate orders were placed.  An order for sleep medication was placed around 2103.  Lennon AlstromClay, Amairany Schumpert N  12/22/2015  1:07 AM

## 2015-12-22 NOTE — Transfer of Care (Signed)
Immediate Anesthesia Transfer of Care Note  Patient: Elijah ColumbiaJoseph Shubert  Procedure(s) Performed: Procedure(s): CYSTOSCOPY/RETROGRADE/URETEROSCOPY  Patient Location: PACU  Anesthesia Type:General  Level of Consciousness: sedated  Airway & Oxygen Therapy: Patient Spontanous Breathing and Patient connected to face mask oxygen  Post-op Assessment: Report given to RN and Post -op Vital signs reviewed and stable  Post vital signs: Reviewed and stable  Last Vitals:  Vitals:   12/22/15 1300 12/22/15 1857  BP: (!) 125/59 126/78  Pulse: (!) 55 (!) 50  Resp: 19 18  Temp: 36.7 C 36.5 C    Last Pain:  Vitals:   12/22/15 1857  TempSrc:   PainSc: Asleep      Patients Stated Pain Goal: 1 (12/22/15 0824)  Complications: No apparent anesthesia complications

## 2015-12-23 ENCOUNTER — Encounter: Payer: Self-pay | Admitting: Urology

## 2015-12-23 MED ORDER — ONDANSETRON HCL 4 MG PO TABS: 4.0000 mg | ORAL_TABLET | Freq: Four times a day (QID) | ORAL | Status: DC | PRN

## 2015-12-23 MED ORDER — ONDANSETRON HCL 4 MG/2ML IJ SOLN
4.0000 mg | INTRAMUSCULAR | Status: DC | PRN
  Administered 2015-12-23 (×3): 4 mg via INTRAVENOUS
  Filled 2015-12-23 (×2): qty 2

## 2015-12-23 NOTE — Progress Notes (Signed)
12/23/2015 12:30  Went to give pt AVS and prepare for discharge.  Discussed discharge instructions and pt signed AVS.   Pt expressed concern that he could not find a ride home.  He said He was willing to just walk out if he could not get a ride.  I removed his IV and asked him to wait while I spoke with LCSW.  Discussed this issue with LCSW.  She ex[plained that it is this patient's right to walk out uupon discharge if he chooses.  I returned to patient's room to discuss this further to find pt and all personal belongings were gone from the room.  Pt left his AVS behind.  Discussed this with charge nurse on shift.  Bradly Chrisougherty, Almus Woodham E, RN

## 2015-12-23 NOTE — Anesthesia Postprocedure Evaluation (Signed)
Anesthesia Post Note  Patient: Elijah Reyes  Procedure(s) Performed: Procedure(s): CYSTOSCOPY/RETROGRADE/URETEROSCOPY  Patient location during evaluation: PACU Anesthesia Type: General Level of consciousness: awake and alert Pain management: pain level controlled Vital Signs Assessment: post-procedure vital signs reviewed and stable Respiratory status: spontaneous breathing, nonlabored ventilation, respiratory function stable and patient connected to nasal cannula oxygen Cardiovascular status: blood pressure returned to baseline and stable Postop Assessment: no signs of nausea or vomiting Anesthetic complications: no    Last Vitals:  Vitals:   12/22/15 2045 12/22/15 2144  BP: 126/74 (!) 113/57  Pulse: (!) 44 (!) 43  Resp: 18 16  Temp: 36.9 C 36.8 C    Last Pain:  Vitals:   12/23/15 0428  TempSrc:   PainSc: 7                  Yevette EdwardsJames G Jamoni Broadfoot

## 2015-12-23 NOTE — Discharge Summary (Signed)
[facility]   PATIENT NAME: Elijah Reyes    MR#:  [id number]  DATE OF BIRTH:  [birth year]  DATE OF ADMISSION:  12/21/2015 ADMITTING PHYSICIAN: Wyatt Hasteavid K Hower, MD  DATE OF DISCHARGE: 12/23/2015  PRIMARY CARE PHYSICIAN: No PCP Per Patient    ADMISSION DIAGNOSIS:  Ureterolithiasis [N20.1] Left flank pain [R10.9]  DISCHARGE DIAGNOSIS:  Active Problems:   Intractable pain   Nephrolithiasis   SECONDARY DIAGNOSIS:   Past Medical History:  Diagnosis Date  . Arthritis   . Kidney stone   . Pneumonia     HOSPITAL COURSE:   50 year old male with past medical history of nephrolithiasis, osteoarthritis, pneumonia who presents to the hospital due to left-sided flank pain and nausea vomiting.  1. Left-sided nephrolithiasis-this was the cause of patient's pain nausea and vomiting. - Patient was treated supportively with IV fluids, antiemetics, Toradol. A urology consult was obtained. Due to his ongoing pain patient underwent a ureteroscopy with stone extraction. -He still continues to have plain but has improved since admission. His renal function is stable and he is voiding well and therefore being discharged home with follow-up with urology as needed.  2. Acute kidney injury-secondary to nephrolithiasis.  -Improved and resolved with IV fluid hydration.  3. Leukocytosis-secondary to nephrolithiasis.  -Resolved  DISCHARGE CONDITIONS:   Stable  CONSULTS OBTAINED:  Treatment Team:  Wyatt Hasteavid K Hower, MD Crist FatBenjamin W Herrick, MD  DRUG ALLERGIES:   Allergies  Allergen Reactions  . Erythromycin Nausea Only and Other (See Comments)    Severe stomach cramps  . Ibuprofen Other (See Comments)    heartburn  . Naproxen Other (See Comments)    heartburn    DISCHARGE MEDICATIONS:     Medication List    TAKE these medications   ranitidine 150 MG tablet Commonly known as:  ZANTAC Take 450 mg by mouth 2 (two) times daily.          DISCHARGE INSTRUCTIONS:   DIET:  Regular diet  DISCHARGE CONDITION:  Stable  ACTIVITY:  Activity as tolerated  OXYGEN:  Home Oxygen: No.   Oxygen Delivery: room air  DISCHARGE LOCATION:  home   If you experience worsening of your admission symptoms, develop shortness of breath, life threatening emergency, suicidal or homicidal thoughts you must seek medical attention immediately by calling 911 or calling your MD immediately  if symptoms less severe.  You Must read complete instructions/literature along with all the possible adverse reactions/side effects for all the Medicines you take and that have been prescribed to you. Take any new Medicines after you have completely understood and accpet all the possible adverse reactions/side effects.   Please note  You were cared for by a hospitalist during your hospital stay. If you have any questions about your discharge medications or the care you received while you were in the hospital after you are discharged, you can call the unit and asked to speak with the hospitalist on call if the hospitalist that took care of you is not available. Once you are discharged, your primary care physician will handle any further medical issues. Please note that NO REFILLS for any discharge medications will be authorized once you are discharged, as it is imperative that you return to your primary care physician (or establish a relationship with a primary care physician if you do not have one) for your aftercare needs so that they can reassess your need for medications and monitor your lab values.  Today   Still complaining of right flank pain. No N/V.  S/p ureteroscopy and extraction of stone yesterday.   VITAL SIGNS:  Blood pressure (!) 113/57, pulse (!) 43, temperature 98.3 F (36.8 C), temperature source Oral, resp. rate 16, height 6' (1.829 m), weight 89.4 kg (197 lb 3.2 oz), SpO2 99 %.  I/O:   Intake/Output Summary (Last 24 hours)  at 12/23/15 1412 Last data filed at 12/23/15 1024  Gross per 24 hour  Intake             2619 ml  Output              925 ml  Net             1694 ml    PHYSICAL EXAMINATION:  GENERAL:  50 y.o.-year-old patient lying in the bed with no acute distress.  EYES: Pupils equal, round, reactive to light and accommodation. No scleral icterus. Extraocular muscles intact.  HEENT: Head atraumatic, normocephalic. Oropharynx and nasopharynx clear.  NECK:  Supple, no jugular venous distention. No thyroid enlargement, no tenderness.  LUNGS: Normal breath sounds bilaterally, no wheezing, rales,rhonchi. No use of accessory muscles of respiration.  CARDIOVASCULAR: S1, S2 normal. No murmurs, rubs, or gallops.  ABDOMEN: Soft, mild right flank pain, non-distended. Bowel sounds present. No organomegaly or mass.  EXTREMITIES: No pedal edema, cyanosis, or clubbing.  NEUROLOGIC: Cranial nerves II through XII are intact. No focal motor or sensory defecits b/l.  PSYCHIATRIC: The patient is alert and oriented x 3. Good affect.  SKIN: No obvious rash, lesion, or ulcer.   DATA REVIEW:   CBC  Recent Labs Lab 12/22/15 0504  WBC 12.0*  HGB 14.3  HCT 41.1  PLT 171    Chemistries   Recent Labs Lab 12/22/15 0504  NA 138  K 3.5  CL 107  CO2 22  GLUCOSE 107*  BUN 16  CREATININE 1.50*  CALCIUM 8.8*    Cardiac Enzymes No results for input(s): TROPONINI in the last 168 hours.    RADIOLOGY:  Ct Renal Stone Study  Result Date: 12/21/2015 CLINICAL DATA:  Acute LEFT flank pain this morning with nausea and vomiting, history of kidney stones, he smoker smoking EXAM: CT ABDOMEN AND PELVIS WITHOUT CONTRAST TECHNIQUE: Multidetector CT imaging of the abdomen and pelvis was performed following the standard protocol without IV contrast. Sagittal and coronal MPR images reconstructed from axial data set. Oral contrast not administered for this indication. COMPARISON:  None FINDINGS: Lower chest: Lung bases clear  Hepatobiliary: Normal appearance Pancreas: Normal appearance Spleen: Minimally enlarged 16.2 x 5.2 x 11.4 cm (volume = 500 cm3). No focal abnormality. Adrenals/Urinary Tract: Adrenal glands normal appearance. Tiny nonobstructing calculus inferior pole RIGHT kidney. Tiny nonobstructing calculus mid LEFT kidney image 31. Additionally, mild LEFT hydronephrosis and hydroureter terminating at a 2 mm distal LEFT ureteral calculus image 74. Mild enlargement of LEFT kidney with mild perinephric and peripelvic edema. Bladder unremarkable. Stomach/Bowel: Normal appendix. Small collapsed hiatal hernia. Stomach and bowel loops otherwise grossly unremarkable for technique. Vascular/Lymphatic: Aorta normal caliber. Few scattered pelvic phleboliths. No adenopathy. Reproductive: N/A Other: No free air or free fluid.  No inguinal or ventral hernia. Musculoskeletal: 5 mm anterolisthesis degenerative disc disease changes at L5-S1 with BILATERAL spondylolysis of L5 noted. No acute osseous findings. IMPRESSION: Tiny BILATERAL nonobstructing renal calculi. LEFT hydronephrosis and hydroureter secondary to a 2 mm distal LEFT ureteral calculus. Minimal splenomegaly. 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 Electronically Signed   By: Ulyses SouthwardMark  Boles M.D.   On: 12/21/2015 15:17      Management plans discussed with the patient, family and they are in agreement.  CODE STATUS:     Code Status Orders        Start     Ordered   12/21/15 1859  Full code  Continuous     12/21/15 1858    Code Status History    Date Active Date Inactive Code Status Order ID Comments User Context   This patient has a current code status but no historical code status.      TOTAL TIME TAKING CARE OF THIS PATIENT: 40 minutes.    Houston SirenSAINANI,Rodriguez Aguinaldo J M.D on 12/23/2015 at 2:12 PM  Between 7am to 6pm - Pager - [phone]  After 6pm go to [email or web address] - Pharmacist, hospitalpassword [email or web address]  [facility] [place] [facility]  Office  [phone]  CC: Primary care physician; No PCP Per Patient

## 2015-12-23 NOTE — Progress Notes (Signed)
[facility] to assume care

## 2015-12-23 NOTE — Progress Notes (Signed)
Dr. Lenord FellersHuglemeyer notified of patient having nausea and new orders received to change zofran to every 4  hours.

## 2015-12-23 NOTE — Care Management (Signed)
Patient admitted with nephrolithiasis . Cystoscopy completed.  Patient with history of IV drug use, and admits to currently being active. Patient to discharge today.  No new prescriptions at time of discharge.  [place]   Patient does not qualify for [facility] and medication management resources.  RNCM signing off

## 2016-01-01 LAB — STONE ANALYSIS
CA OXALATE, MONOHYDR.: 90 %
CA PHOS CRY STONE QL IR: 10 %
Stone Weight KSTONE: 17 mg

## 2018-05-08 IMAGING — CT CT RENAL STONE PROTOCOL
2 of 4 series · 16 of 46 positions shown, 18 images · non-contrast
Comparison: None

CLINICAL DATA: Acute LEFT flank pain this morning with nausea and
vomiting, history of kidney stones, he smoker smoking

EXAM:
CT ABDOMEN AND PELVIS WITHOUT CONTRAST
TECHNIQUE: Multidetector CT imaging of the abdomen and pelvis was performed
following the standard protocol without IV contrast. Sagittal and
coronal MPR images reconstructed from axial data set. Oral contrast
not administered for this indication.

[Series 2: axial st · axial · 0.76mm/px · z∈[-308,+112]mm · 13 of 92 slices shown, 15 images]
[im 4/92  soft-tissue]
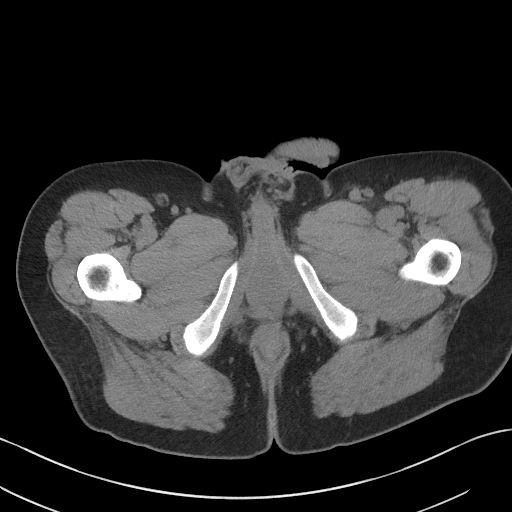
[im 4/92  bone]
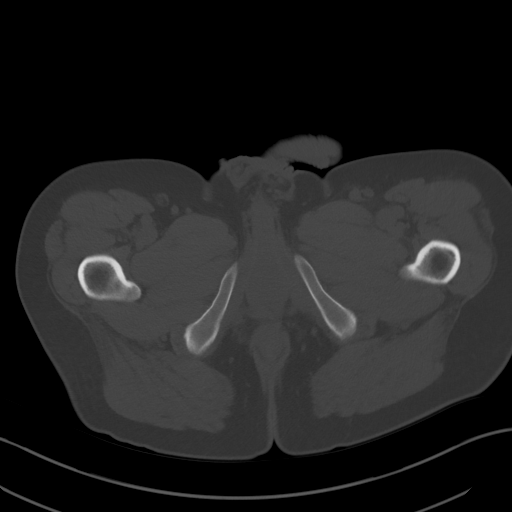
[im 11/92  soft-tissue]
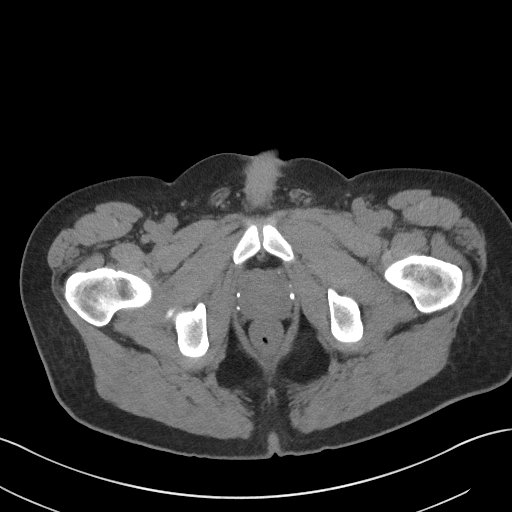
[im 18/92  soft-tissue]
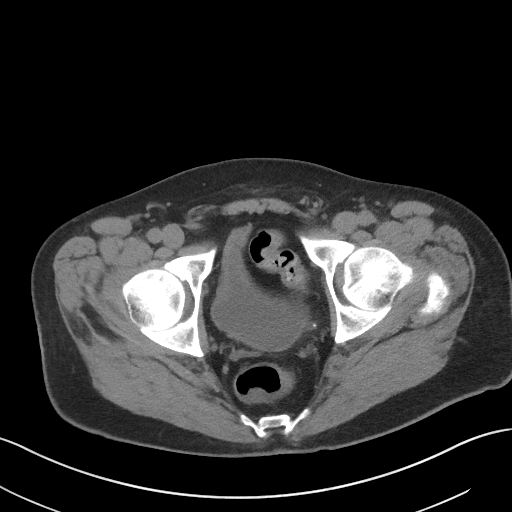
[im 25/92  soft-tissue]
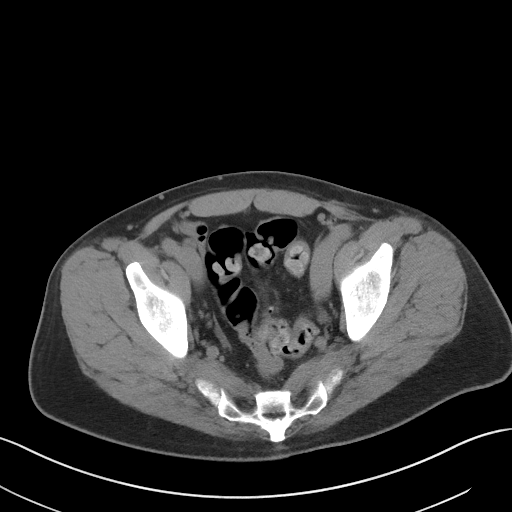
[im 32/92  soft-tissue]
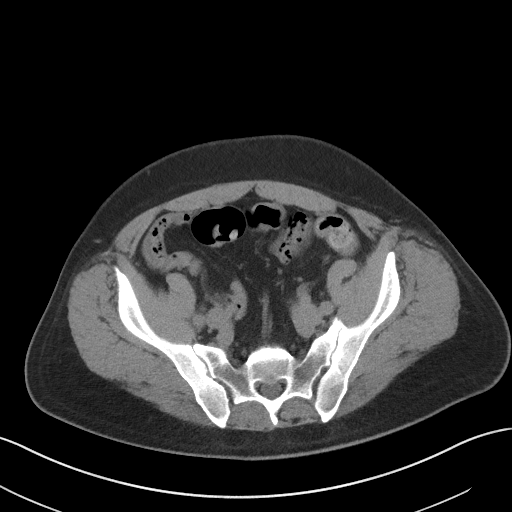
[im 39/92  soft-tissue]
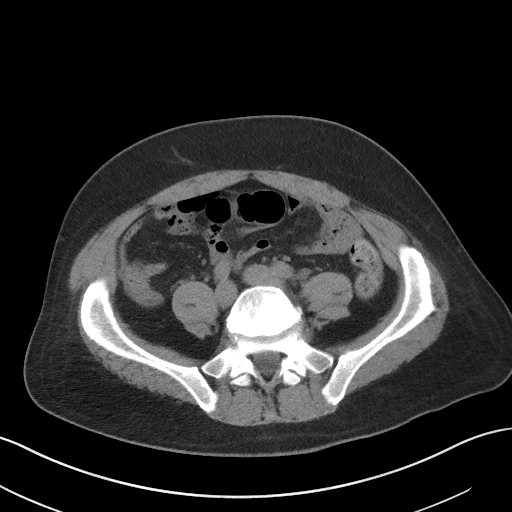
[im 46/92  soft-tissue]
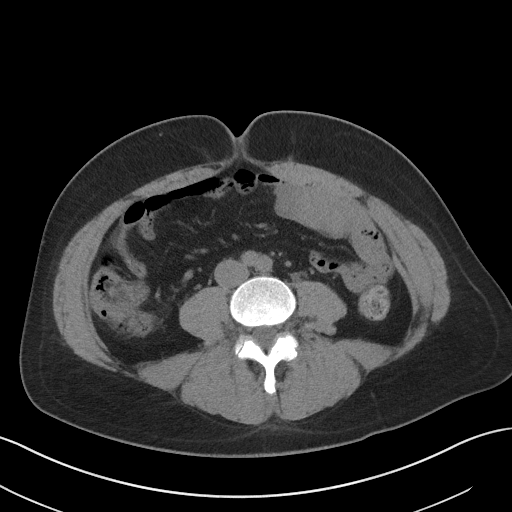
[im 53/92  soft-tissue]
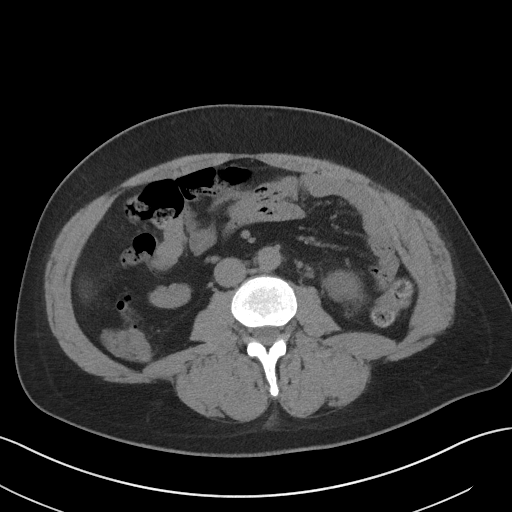
[im 60/92  soft-tissue]
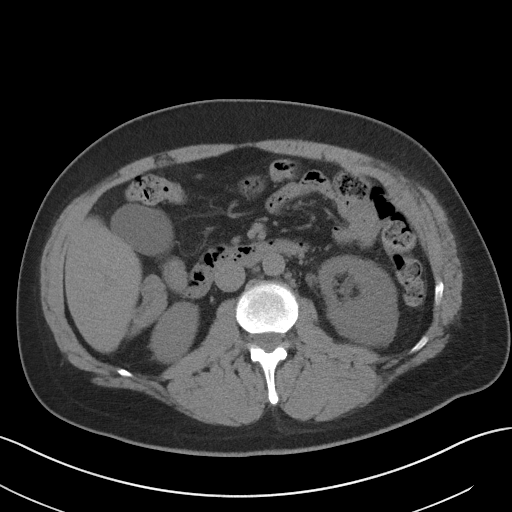
[im 60/92  bone]
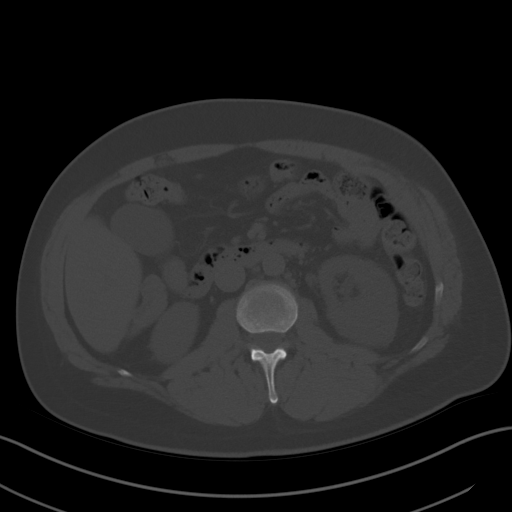
[im 67/92  soft-tissue]
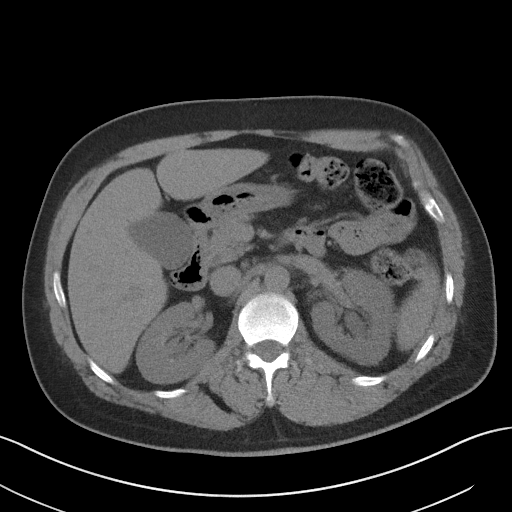
[im 74/92  soft-tissue]
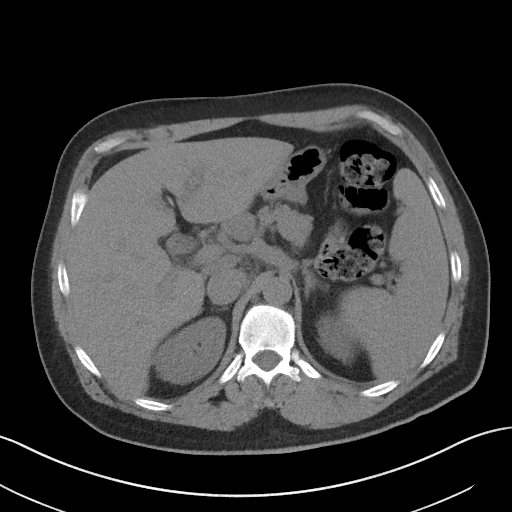
[im 81/92  soft-tissue]
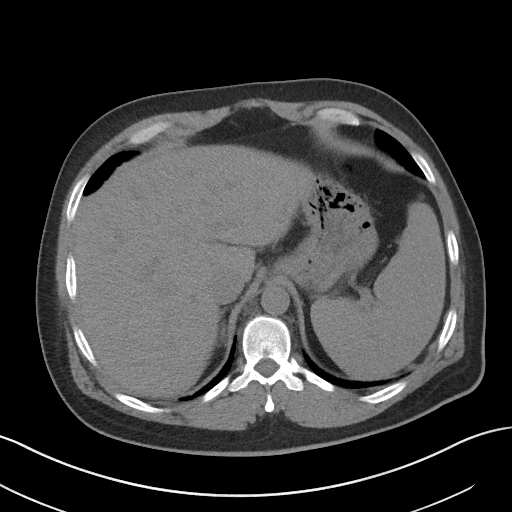
[im 88/92  soft-tissue]
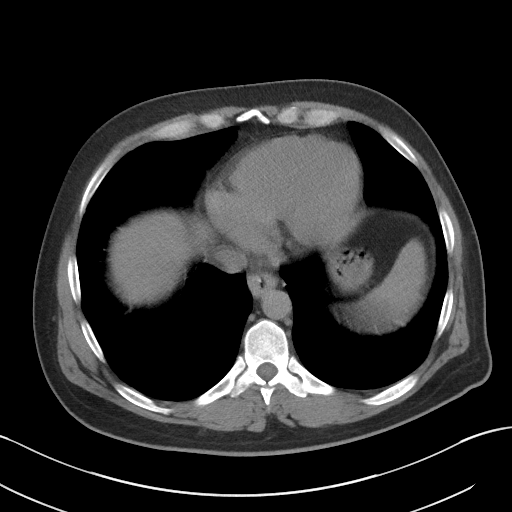

[Series 5: coronal · coronal · 0.77mm/px · 3 of 141 slices shown]
[im 47/141  soft-tissue]
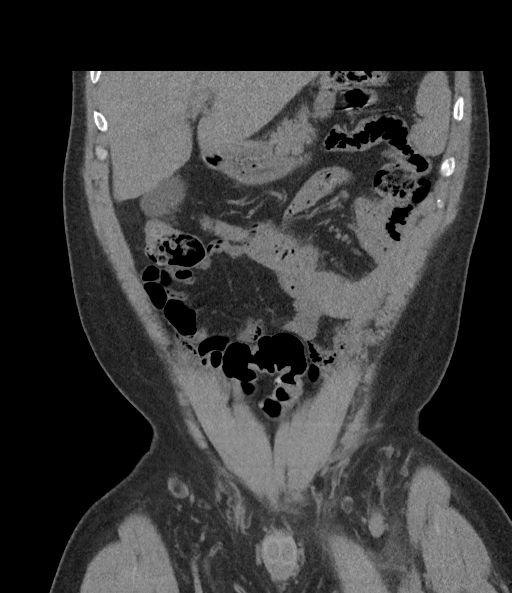
[im 63/141  soft-tissue]
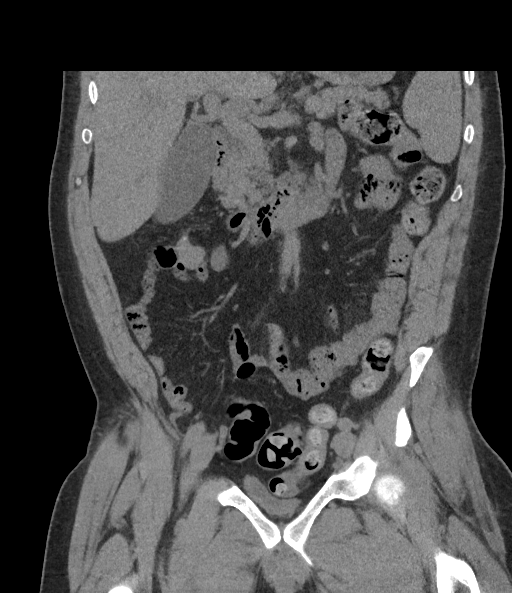
[im 78/141  soft-tissue]
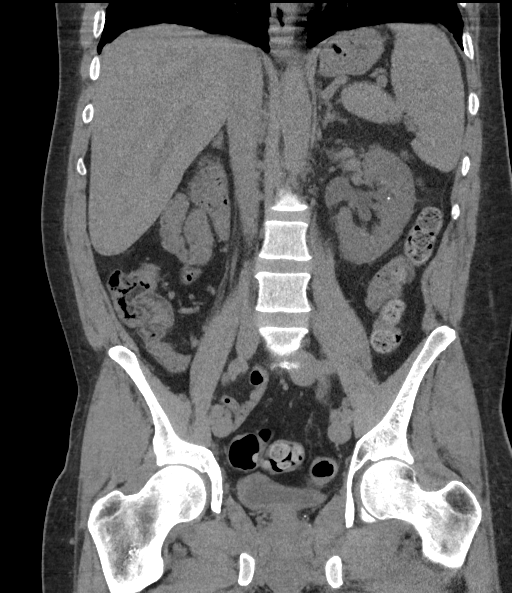

[16 of 46 positions shown; findings below may reference images not displayed]

FINDINGS: Lower chest: Lung bases clear

Hepatobiliary: Normal appearance

Pancreas: Normal appearance

Spleen: Minimally enlarged 16.2 x 5.2 x 11.4 cm (volume = 500 cm3).
No focal abnormality.

Adrenals/Urinary Tract: Adrenal glands normal appearance. Tiny
nonobstructing calculus inferior pole RIGHT kidney. Tiny
nonobstructing calculus mid LEFT kidney image 31. Additionally, mild
LEFT hydronephrosis and hydroureter terminating at a 2 mm distal
LEFT ureteral calculus image 74. Mild enlargement of LEFT kidney
with mild perinephric and peripelvic edema. Bladder unremarkable.

Stomach/Bowel: Normal appendix. Small collapsed hiatal hernia.
Stomach and bowel loops otherwise grossly unremarkable for
technique.

Vascular/Lymphatic: Aorta normal caliber. Few scattered pelvic
phleboliths. No adenopathy.

Reproductive: N/A

Other: No free air or free fluid.  No inguinal or ventral hernia.

Musculoskeletal: 5 mm anterolisthesis degenerative disc disease
changes at L5-S1 with BILATERAL spondylolysis of L5 noted. No acute
osseous findings.
IMPRESSION: Tiny BILATERAL nonobstructing renal calculi.

LEFT hydronephrosis and hydroureter secondary to a 2 mm distal LEFT
ureteral calculus.

Minimal splenomegaly.

BILATERAL spondylolysis L5 with 5 mm of anterolisthesis and
degenerative disc disease changes at L5-S1.
